# Patient Record
Sex: Female | Born: 1950 | Race: White | Hispanic: No | Marital: Married | Smoking: Never smoker
Health system: Southern US, Community
[De-identification: ages and names within clinical notes are randomized; demographics above are authoritative.]

## PROBLEM LIST (undated history)

## (undated) DIAGNOSIS — K5730 Diverticulosis of large intestine without perforation or abscess without bleeding: Secondary | ICD-10-CM

## (undated) DIAGNOSIS — K589 Irritable bowel syndrome without diarrhea: Secondary | ICD-10-CM

## (undated) DIAGNOSIS — J329 Chronic sinusitis, unspecified: Secondary | ICD-10-CM

## (undated) DIAGNOSIS — Z780 Asymptomatic menopausal state: Secondary | ICD-10-CM

## (undated) DIAGNOSIS — R748 Abnormal levels of other serum enzymes: Secondary | ICD-10-CM

## (undated) DIAGNOSIS — K219 Gastro-esophageal reflux disease without esophagitis: Secondary | ICD-10-CM

## (undated) DIAGNOSIS — I10 Essential (primary) hypertension: Secondary | ICD-10-CM

## (undated) DIAGNOSIS — C762 Malignant neoplasm of abdomen: Secondary | ICD-10-CM

## (undated) DIAGNOSIS — E785 Hyperlipidemia, unspecified: Secondary | ICD-10-CM

## (undated) DIAGNOSIS — N281 Cyst of kidney, acquired: Secondary | ICD-10-CM

## (undated) DIAGNOSIS — C181 Malignant neoplasm of appendix: Secondary | ICD-10-CM

## (undated) DIAGNOSIS — F419 Anxiety disorder, unspecified: Secondary | ICD-10-CM

## (undated) DIAGNOSIS — K635 Polyp of colon: Secondary | ICD-10-CM

## (undated) HISTORY — DX: Polyp of colon: K63.5

## (undated) HISTORY — DX: Anxiety disorder, unspecified: F41.9

## (undated) HISTORY — DX: Diverticulosis of large intestine without perforation or abscess without bleeding: K57.30

## (undated) HISTORY — PX: HEMICOLECTOMY: SHX854

## (undated) HISTORY — DX: Asymptomatic menopausal state: Z78.0

## (undated) HISTORY — DX: Abnormal levels of other serum enzymes: R74.8

## (undated) HISTORY — PX: APPENDECTOMY: SHX54

## (undated) HISTORY — DX: Irritable bowel syndrome, unspecified: K58.9

## (undated) HISTORY — DX: Chronic sinusitis, unspecified: J32.9

## (undated) HISTORY — DX: Gastro-esophageal reflux disease without esophagitis: K21.9

## (undated) HISTORY — DX: Essential (primary) hypertension: I10

## (undated) HISTORY — DX: Cyst of kidney, acquired: N28.1

## (undated) HISTORY — DX: Hyperlipidemia, unspecified: E78.5

## (undated) HISTORY — DX: Malignant neoplasm of abdomen: C76.2

## (undated) HISTORY — PX: KNEE SURGERY: SHX244

## (undated) HISTORY — PX: WRIST SURGERY: SHX841

## (undated) HISTORY — DX: Malignant neoplasm of appendix: C18.1

---

## 2000-02-22 HISTORY — PX: ABDOMINAL HYSTERECTOMY: SHX81

## 2009-10-22 DIAGNOSIS — C181 Malignant neoplasm of appendix: Secondary | ICD-10-CM

## 2009-10-22 HISTORY — DX: Malignant neoplasm of appendix: C18.1

## 2009-11-17 ENCOUNTER — Inpatient Hospital Stay (HOSPITAL_COMMUNITY): Admission: RE | Admit: 2009-11-17 | Discharge: 2009-11-22 | Payer: Self-pay | Admitting: Family Medicine

## 2009-11-18 ENCOUNTER — Encounter (INDEPENDENT_AMBULATORY_CARE_PROVIDER_SITE_OTHER): Payer: Self-pay | Admitting: General Surgery

## 2009-11-23 ENCOUNTER — Ambulatory Visit: Payer: Self-pay | Admitting: Oncology

## 2010-03-09 ENCOUNTER — Ambulatory Visit: Payer: Self-pay | Admitting: Internal Medicine

## 2010-05-06 LAB — URINALYSIS, ROUTINE W REFLEX MICROSCOPIC
Glucose, UA: NEGATIVE mg/dL
Ketones, ur: 40 mg/dL — AB
Leukocytes, UA: NEGATIVE
Nitrite: NEGATIVE
Specific Gravity, Urine: >1.046 — ABNORMAL HIGH (ref 1.005–1.030)
pH: 6 (ref 5.0–8.0)

## 2010-05-06 LAB — DIFFERENTIAL
Basophils Absolute: 0 10*3/uL (ref 0.0–0.1)
Lymphocytes Relative: 13 % (ref 12–46)
Lymphs Abs: 1.5 10*3/uL (ref 0.7–4.0)
Neutro Abs: 10.1 10*3/uL — ABNORMAL HIGH (ref 1.7–7.7)
Neutrophils Relative %: 85 % — ABNORMAL HIGH (ref 43–77)

## 2010-05-06 LAB — BASIC METABOLIC PANEL
CO2: 26 mEq/L (ref 19–32)
Calcium: 8.4 mg/dL (ref 8.4–10.5)
Creatinine, Ser: 0.58 mg/dL (ref 0.4–1.2)
GFR calc Af Amer: >60 mL/min (ref >60)
GFR calc non Af Amer: >60 mL/min (ref >60)
Glucose, Bld: 190 mg/dL — ABNORMAL HIGH (ref 70–99)

## 2010-05-06 LAB — COMPREHENSIVE METABOLIC PANEL
BUN: 9 mg/dL (ref 6–23)
CO2: 26 mEq/L (ref 19–32)
Calcium: 8.9 mg/dL (ref 8.4–10.5)
Chloride: 105 mEq/L (ref 96–112)
Creatinine, Ser: 0.53 mg/dL (ref 0.4–1.2)
GFR calc non Af Amer: >60 mL/min (ref >60)
Total Bilirubin: 0.8 mg/dL (ref 0.3–1.2)

## 2010-05-06 LAB — CBC
Hemoglobin: 12.5 g/dL (ref 12.0–15.0)
Hemoglobin: 9.7 g/dL — ABNORMAL LOW (ref 12.0–15.0)
MCH: 29.5 pg (ref 26.0–34.0)
MCH: 29.8 pg (ref 26.0–34.0)
MCHC: 34.5 g/dL (ref 30.0–36.0)
MCHC: 34.7 g/dL (ref 30.0–36.0)
MCV: 85.9 fL (ref 78.0–100.0)
Platelets: 229 10*3/uL (ref 150–400)
Platelets: 248 10*3/uL (ref 150–400)
RBC: 3.26 MIL/uL — ABNORMAL LOW (ref 3.87–5.11)
RBC: 3.94 MIL/uL (ref 3.87–5.11)
RBC: 4.21 MIL/uL (ref 3.87–5.11)
RDW: 12.1 % (ref 11.5–15.5)
WBC: 9.6 10*3/uL (ref 4.0–10.5)

## 2010-05-06 LAB — POCT I-STAT, CHEM 8
BUN: 14 mg/dL (ref 6–23)
Calcium, Ion: 0.98 mmol/L — ABNORMAL LOW (ref 1.12–1.32)
Chloride: 105 mEq/L (ref 96–112)
Creatinine, Ser: 0.5 mg/dL (ref 0.4–1.2)
Glucose, Bld: 91 mg/dL (ref 70–99)
HCT: 39 % (ref 36.0–46.0)
Hemoglobin: 13.3 g/dL (ref 12.0–15.0)
Potassium: 4.6 mEq/L (ref 3.5–5.1)
Sodium: 135 mEq/L (ref 135–145)
TCO2: 24 mmol/L (ref 0–100)

## 2010-05-06 LAB — ANAEROBIC CULTURE

## 2010-05-06 LAB — URINE MICROSCOPIC-ADD ON

## 2010-05-06 LAB — CULTURE, ROUTINE-ABSCESS: Culture: NORMAL

## 2010-06-01 ENCOUNTER — Ambulatory Visit (INDEPENDENT_AMBULATORY_CARE_PROVIDER_SITE_OTHER): Payer: BC Managed Care – PPO | Admitting: Internal Medicine

## 2010-06-01 DIAGNOSIS — K922 Gastrointestinal hemorrhage, unspecified: Secondary | ICD-10-CM

## 2010-06-01 DIAGNOSIS — J309 Allergic rhinitis, unspecified: Secondary | ICD-10-CM

## 2010-06-01 DIAGNOSIS — E785 Hyperlipidemia, unspecified: Secondary | ICD-10-CM

## 2010-07-01 ENCOUNTER — Encounter: Payer: Self-pay | Admitting: Internal Medicine

## 2010-07-21 ENCOUNTER — Ambulatory Visit (INDEPENDENT_AMBULATORY_CARE_PROVIDER_SITE_OTHER): Payer: BC Managed Care – PPO | Admitting: Internal Medicine

## 2010-07-21 DIAGNOSIS — G4700 Insomnia, unspecified: Secondary | ICD-10-CM

## 2010-07-21 DIAGNOSIS — M7660 Achilles tendinitis, unspecified leg: Secondary | ICD-10-CM

## 2010-08-30 ENCOUNTER — Telehealth: Payer: Self-pay | Admitting: Emergency Medicine

## 2010-08-30 ENCOUNTER — Other Ambulatory Visit: Payer: Self-pay | Admitting: Internal Medicine

## 2010-08-30 DIAGNOSIS — I10 Essential (primary) hypertension: Secondary | ICD-10-CM

## 2010-08-30 MED ORDER — ENALAPRIL MALEATE 5 MG PO TABS: ORAL_TABLET | ORAL | Status: DC

## 2010-08-30 NOTE — Telephone Encounter (Signed)
Pt requests refill of Enalapril 7.5mg  one tab PO q day, #90 to [facility] order pharamcy

## 2010-09-02 ENCOUNTER — Other Ambulatory Visit: Payer: Self-pay | Admitting: Emergency Medicine

## 2010-09-02 MED ORDER — FLUTICASONE PROPIONATE 50 MCG/ACT NA SUSP: 1.0000 | Freq: Every day | NASAL | Status: DC

## 2010-09-02 NOTE — Telephone Encounter (Signed)
[facility] with verbal instructions for refill of Flonase

## 2010-09-02 NOTE — Telephone Encounter (Signed)
Received faxed refill request for pt's Flonase from [facility].

## 2010-09-02 NOTE — Telephone Encounter (Signed)
[facility] with authorization for refill

## 2010-09-06 ENCOUNTER — Telehealth: Payer: Self-pay | Admitting: Emergency Medicine

## 2010-09-06 NOTE — Telephone Encounter (Signed)
Spoke with Baxter Hire, pharmacy tech at [facility] verbal order for refill of Enalapril as written in last telephone encounter.  Pt is aware refills called in, apologized for delay.  Pt reports she has enough to make it until her mail order prescription arrives.  Advised her to call if any problems.

## 2010-09-16 ENCOUNTER — Encounter: Payer: Self-pay | Admitting: Internal Medicine

## 2010-09-16 ENCOUNTER — Ambulatory Visit (INDEPENDENT_AMBULATORY_CARE_PROVIDER_SITE_OTHER): Payer: BC Managed Care – PPO | Admitting: Internal Medicine

## 2010-09-16 VITALS — BP 127/75 | HR 71 | Temp 98.4°F | Resp 12 | Ht 61.0 in | Wt 154.0 lb

## 2010-09-16 DIAGNOSIS — Q619 Cystic kidney disease, unspecified: Secondary | ICD-10-CM

## 2010-09-16 DIAGNOSIS — Z23 Encounter for immunization: Secondary | ICD-10-CM

## 2010-09-16 DIAGNOSIS — N952 Postmenopausal atrophic vaginitis: Secondary | ICD-10-CM

## 2010-09-16 DIAGNOSIS — Z0000 Encounter for general adult medical examination without abnormal findings: Secondary | ICD-10-CM

## 2010-09-16 DIAGNOSIS — R319 Hematuria, unspecified: Secondary | ICD-10-CM

## 2010-09-16 DIAGNOSIS — N281 Cyst of kidney, acquired: Secondary | ICD-10-CM

## 2010-09-16 LAB — POCT URINALYSIS DIPSTICK
Bilirubin, UA: NEGATIVE
Glucose, UA: NEGATIVE
Ketones, UA: NEGATIVE
Leukocytes, UA: NEGATIVE

## 2010-09-16 MED ORDER — TETANUS-DIPHTH-ACELL PERTUSSIS 5-2.5-18.5 LF-MCG/0.5 IM SUSP: 0.5000 mL | Freq: Once | INTRAMUSCULAR | Status: DC

## 2010-09-16 MED ORDER — ESTRADIOL 10 MCG VA TABS: ORAL_TABLET | VAGINAL | Status: AC

## 2010-09-18 ENCOUNTER — Encounter: Payer: Self-pay | Admitting: Internal Medicine

## 2010-09-18 DIAGNOSIS — N281 Cyst of kidney, acquired: Secondary | ICD-10-CM | POA: Insufficient documentation

## 2010-09-18 DIAGNOSIS — K589 Irritable bowel syndrome without diarrhea: Secondary | ICD-10-CM | POA: Insufficient documentation

## 2010-09-18 DIAGNOSIS — J329 Chronic sinusitis, unspecified: Secondary | ICD-10-CM | POA: Insufficient documentation

## 2010-09-18 DIAGNOSIS — Z780 Asymptomatic menopausal state: Secondary | ICD-10-CM | POA: Insufficient documentation

## 2010-09-18 DIAGNOSIS — E785 Hyperlipidemia, unspecified: Secondary | ICD-10-CM | POA: Insufficient documentation

## 2010-09-18 DIAGNOSIS — F419 Anxiety disorder, unspecified: Secondary | ICD-10-CM | POA: Insufficient documentation

## 2010-09-18 DIAGNOSIS — K635 Polyp of colon: Secondary | ICD-10-CM | POA: Insufficient documentation

## 2010-09-18 DIAGNOSIS — R748 Abnormal levels of other serum enzymes: Secondary | ICD-10-CM | POA: Insufficient documentation

## 2010-09-18 DIAGNOSIS — A6000 Herpesviral infection of urogenital system, unspecified: Secondary | ICD-10-CM | POA: Insufficient documentation

## 2010-09-18 DIAGNOSIS — R319 Hematuria, unspecified: Secondary | ICD-10-CM | POA: Insufficient documentation

## 2010-09-18 DIAGNOSIS — K219 Gastro-esophageal reflux disease without esophagitis: Secondary | ICD-10-CM | POA: Insufficient documentation

## 2010-09-18 DIAGNOSIS — K5730 Diverticulosis of large intestine without perforation or abscess without bleeding: Secondary | ICD-10-CM | POA: Insufficient documentation

## 2010-09-18 NOTE — Progress Notes (Addendum)
Subjective:    Patient ID: Jacqueline Buchanan, female    DOB: 09/08/1950, 60 y.o.   MRN: [id number]  HPI  Jacqueline Buchanan is overall doing well.  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  She has been trying very hard to lose weight and is proud of the fact she has lost 10 lbs.  She is exercising much more and watching her diet.   Her last visit with her surgical oncologist for Stage II appendiceal carcinoma, Dr. Flonnie Hailstone went well.  See correspondence note.  CEA was 0.9 and CT will be done at next visit with him.  She has occasional crampy pain in area of surgery but none recently.  No BRBPR or dark stools.  Appetitie good.  She had an abd CT in 2011 which showed incidental finding of a questionable complex renal cyst on the Left.  She did not go for U/S as we discussed as she was still trying to recover emotionally from her appendiceal cancer treatment.  She reports vaginal dryness which causes painful sex at times.  No vaginal discharge.  No vaginal bleeding  Last mamogram 12/11 neg. Colonoscopy 2011.  No Known Allergies Past Medical History  Diagnosis Date  . Hypertension   . Hematuria     Chronic  . Sinusitis     Chronic  . 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 Genital herpes   . IBS (irritable bowel syndrome)   . Diverticulosis of colon   . Colon polyps   . Menopause   . Hyperlipidemia   . Renal cyst     left  . GERD (gastroesophageal reflux disease)   . Anxiety   . Abnormal liver enzymes     Chronic bX by Dr. Dorathy Kinsman   Past Surgical History  Procedure Date  . Abdominal hysterectomy 2002  . Appendectomy   . Hemicolectomy     Right  . Knee surgery 1988-2000    multiple  . Wrist surgery 2005-2011    multiple   History   Social History  . Marital Status: Married    Spouse Name: Molly Maduro    Number of Children: 2  . Years of Education: Masters   Occupational History  . TEACHER    Social  History Main Topics  . Smoking status: Never Smoker   . Smokeless tobacco: Never Used  . Alcohol Use: Yes     socially  . Drug Use: No  . Sexually Active: Yes -- Female partner(s)    Birth Control/ Protection: Post-menopausal   Other Topics Concern  . Not on file   Social History Narrative  . No narrative on file   Family History  Problem Relation Age of Onset  . Cancer Mother     ?primary lung, w/ mets to breast  . Cancer Paternal Grandfather     GI  . Breast cancer Paternal Grandmother    Patient Active Problem List  Diagnoses  . Hyperlipidemia  . Hematuria  . Sinusitis  . Cancer of appendix  . Renal cyst  . Genital herpes  . IBS (irritable bowel syndrome)  . GERD (gastroesophageal reflux disease)  . Diverticulosis of colon  . Colon polyps  . Menopause  . Anxiety  . Abnormal liver enzymes   Current Outpatient Prescriptions on File Prior to Visit  Medication Sig Dispense Refill  . aspirin 81 MG tablet Take 81 mg by mouth daily.        Marland Kitchen  b complex vitamins capsule Take 1 capsule by mouth daily.        . calcium gluconate 500 MG tablet Take 500 mg by mouth 2 (two) times daily.        . enalapril (VASOTEC) 5 MG tablet Total dose is 7.5 mg daily.  Take 1 and 1/2 daily  135 tablet  1  . fexofenadine (ALLEGRA) 180 MG tablet Take 180 mg by mouth daily as needed.        Marland Kitchen LORazepam (ATIVAN) 0.5 MG tablet Take 1 mg by mouth at bedtime as needed.       . montelukast (SINGULAIR) 10 MG tablet Take 10 mg by mouth at bedtime.        . Multiple Vitamin (MULTIVITAMIN) tablet Take 1 tablet by mouth daily.        . simvastatin (ZOCOR) 10 MG tablet Take 10 mg by mouth at bedtime.         No current facility-administered medications on file prior to visit.        Review of Systems  See HPI     Objective:   Physical Exam  Nursing note and vitals reviewed. Constitutional: She is oriented to person, place, and time. She appears well-developed and well-nourished.  HENT:    Head: Normocephalic and atraumatic.  Right Ear: Tympanic membrane and ear canal normal. No drainage. Tympanic membrane is not injected and not erythematous.  Left Ear: Tympanic membrane and ear canal normal. No drainage. Tympanic membrane is not injected and not erythematous.  Nose: Nose normal. Right sinus exhibits no maxillary sinus tenderness and no frontal sinus tenderness. Left sinus exhibits no maxillary sinus tenderness and no frontal sinus tenderness.  Mouth/Throat: Oropharynx is clear and moist. No oral lesions. No oropharyngeal exudate.  Eyes: Conjunctivae and EOM are normal. Pupils are equal, round, and reactive to light.  Neck: Normal range of motion. Neck supple. No JVD present. Carotid bruit is not present. No mass and no thyromegaly present.  Cardiovascular: Normal rate, regular rhythm, S1 normal, S2 normal and intact distal pulses.  Exam reveals no gallop and no friction rub.   No murmur heard. Pulses:      Carotid pulses are 2+ on the right side, and 2+ on the left side.      Dorsalis pedis pulses are 2+ on the right side, and 2+ on the left side.       No carotid bruit. No LE edema  Pulmonary/Chest: Breath sounds normal. She has no wheezes. She has no rales. She exhibits no tenderness.  Abdominal: Soft. Bowel sounds are normal. She exhibits no distension and no mass. There is no hepatosplenomegaly. There is no tenderness. There is no CVA tenderness.  Musculoskeletal: Normal range of motion.       No active synovitis to joints.    Lymphadenopathy:    She has no cervical adenopathy.    She has no axillary adenopathy.       Right: No inguinal and no supraclavicular adenopathy present.       Left: No inguinal and no supraclavicular adenopathy present.  Neurological: She is alert and oriented to person, place, and time. She has normal strength and normal reflexes. She displays no tremor. No cranial nerve deficit or sensory deficit. Coordination and gait normal.  Skin: Skin is  warm and dry. No rash noted. No cyanosis. Nails show no clubbing.  Psychiatric: She has a normal mood and affect. Her speech is normal and behavior is normal. Cognition and memory  are normal.          Assessment & Plan:   No problem-specific assessment & plan notes found for this encounter. 1)  HM  Will give TDAP today.  See scanned HM note  UTD mammogram and colonoscopy.  I gave a RX for Zostavax with pt education materials.  2) L renal cyst  : will schedule renal ultrasound to further delineate  3) Atrophic vaginitis:  Will try Vagifem 10 mcg. 1 tab nightly for 2 weeks then 2 times per week  4) S/P Stage II appendiceal  CA  Dr. Flonnie Hailstone following  5) Allergic rhinitis.  Stable  6) Hyperlipidemai  REfill Zocor  7) GERD   Stable  8) Anxiety  Improved  9)  Chronic hematuria  Extensive work up in [place] negative   **Renal ultrasound scheduled 10/01/10 @ 1030am, [facility].  Pt aware of appt per K. Harvell-dhp, rn**

## 2010-10-01 ENCOUNTER — Ambulatory Visit (HOSPITAL_BASED_OUTPATIENT_CLINIC_OR_DEPARTMENT_OTHER)
Admission: RE | Admit: 2010-10-01 | Discharge: 2010-10-01 | Disposition: A | Discharge status: Home / Self Care | Payer: BC Managed Care – PPO | Source: Ambulatory Visit | Attending: Internal Medicine | Admitting: Internal Medicine

## 2010-10-01 DIAGNOSIS — N289 Disorder of kidney and ureter, unspecified: Secondary | ICD-10-CM | POA: Insufficient documentation

## 2010-10-01 DIAGNOSIS — N281 Cyst of kidney, acquired: Secondary | ICD-10-CM

## 2010-10-04 ENCOUNTER — Telehealth: Payer: Self-pay | Admitting: Internal Medicine

## 2010-10-04 DIAGNOSIS — G4700 Insomnia, unspecified: Secondary | ICD-10-CM

## 2010-10-04 MED ORDER — LORAZEPAM 1 MG PO TABS: 1.0000 mg | ORAL_TABLET | Freq: Every evening | ORAL | Status: DC | PRN

## 2010-10-04 NOTE — Telephone Encounter (Signed)
Spoke with pt and gave results of renal ultrasound.  She would like a second opinion from nephrologist.  Will discuss with nephrology group and set up referral Will also refill Lorazepam

## 2010-10-04 NOTE — Telephone Encounter (Signed)
[facility] for [facility]epam refill

## 2010-10-06 ENCOUNTER — Telehealth: Payer: Self-pay | Admitting: Internal Medicine

## 2010-10-06 NOTE — Telephone Encounter (Signed)
Spoke with pt.  This may an issue of payment for administration in a doctor's office.  OK for [place] to give if pt wants to pick up vaccine from Pharmacy

## 2010-10-06 NOTE — Telephone Encounter (Signed)
Spoke with telephone consult with Dr. Patsi Sears.  He looked at images at voiced no worrisome concern for cancer in the cysts of L kidney.  OK to watch expectantly and repeat U/S in 6 months.  Pt informed via telephone

## 2010-10-06 NOTE — Telephone Encounter (Signed)
Spoke with pt.  She states that she went to get zoster vaccine at [facility], but her ins would not cover vaccination.  Cash pay price was $180.  She would like to know how important you feel having zoster vaccine is?  She has spoken with her ins company and the only answer she has gotten is simply her plan does not cover zoster vaccine.  If you feel strongly she should be vaccinated, she is willing to try to go up customer service "food chain" to try to have vaccine covered.  She did have chicken pox as a child as well as re-exposure when her children had chicken pox.  She does also have genital herpes, though does not often have out breaks.  What do you suggest she do regarding vaccine?

## 2010-10-11 ENCOUNTER — Other Ambulatory Visit: Payer: Self-pay | Admitting: Internal Medicine

## 2010-10-11 DIAGNOSIS — E785 Hyperlipidemia, unspecified: Secondary | ICD-10-CM

## 2010-11-04 ENCOUNTER — Other Ambulatory Visit: Payer: Self-pay | Admitting: Internal Medicine

## 2010-11-04 ENCOUNTER — Ambulatory Visit (INDEPENDENT_AMBULATORY_CARE_PROVIDER_SITE_OTHER): Payer: 59 | Admitting: Psychology

## 2010-11-04 DIAGNOSIS — F411 Generalized anxiety disorder: Secondary | ICD-10-CM

## 2010-11-04 DIAGNOSIS — G4700 Insomnia, unspecified: Secondary | ICD-10-CM

## 2010-11-04 MED ORDER — LORAZEPAM 1 MG PO TABS: 1.0000 mg | ORAL_TABLET | Freq: Every evening | ORAL | Status: DC | PRN

## 2010-11-04 NOTE — Telephone Encounter (Signed)
Pt request refill for Lorazepam 1 mg tablet Qty 30.  Ph[facility]acy [facility] phone number [phone].  Call back number for pt [phone].

## 2010-11-04 NOTE — Telephone Encounter (Signed)
[facility]

## 2010-11-09 ENCOUNTER — Encounter: Payer: Self-pay | Admitting: Emergency Medicine

## 2010-11-16 ENCOUNTER — Telehealth: Payer: Self-pay | Admitting: Emergency Medicine

## 2010-11-16 MED ORDER — ALBUTEROL SULFATE HFA 108 (90 BASE) MCG/ACT IN AERS: INHALATION_SPRAY | RESPIRATORY_TRACT | Status: DC

## 2010-11-16 MED ORDER — PREDNISONE 20 MG PO TABS: ORAL_TABLET | ORAL | Status: AC

## 2010-11-16 MED ORDER — FLUTICASONE-SALMETEROL 100-50 MCG/DOSE IN AEPB: 1.0000 | INHALATION_SPRAY | Freq: Two times a day (BID) | RESPIRATORY_TRACT | Status: AC

## 2010-11-16 NOTE — Telephone Encounter (Signed)
Spoke with Jacqueline Buchanan.  She states that her allergies are "kicking my butt".  She states she feels poorly- ears, jaw and head hurts.  She is not running a fever, but overall feels bad.  She requests refill of Advair inhaler (last filled 3/11), dose 100/50mg  BID.  Jacqueline Buchanan does say she feels tight in her chest.  She states historically, she has gotten a round of steroids to "calm" allergies.  Would like to know if she can get steroids now.  Aware I will speak to DDS and call with response

## 2010-11-16 NOTE — Telephone Encounter (Signed)
Spoke with Jacqueline Buchanan  She is feeling tight inher chest.   She is out of all inhalers.  See orders,  Will start Albuterol , Advair, and prednisone 60 mg taper

## 2010-11-29 ENCOUNTER — Telehealth: Payer: Self-pay | Admitting: Internal Medicine

## 2010-11-29 NOTE — Telephone Encounter (Signed)
Pt called and states she is have intense right back pain and she have an idea what is going on. She would like a call back as soon as possible; she can be reach at [phone]

## 2010-11-29 NOTE — Telephone Encounter (Signed)
Spoke with Jacqueline Buchanan.  She states that for 2 days, she has been having "right kidney pain".  She states it feels just like a UTI.  "I have had enough of them to know what it feels like".  I advised her to come into the office to see DDS, give urine specimen.  She is agreeable.  Appt scheduled for tomorrow am at 830.

## 2010-11-30 ENCOUNTER — Encounter: Payer: Self-pay | Admitting: Internal Medicine

## 2010-11-30 ENCOUNTER — Ambulatory Visit (INDEPENDENT_AMBULATORY_CARE_PROVIDER_SITE_OTHER): Payer: BC Managed Care – PPO | Admitting: Internal Medicine

## 2010-11-30 VITALS — BP 117/66 | HR 93 | Temp 99.1°F | Resp 16 | Ht 61.0 in | Wt 151.0 lb

## 2010-11-30 DIAGNOSIS — R141 Gas pain: Secondary | ICD-10-CM

## 2010-11-30 DIAGNOSIS — R143 Flatulence: Secondary | ICD-10-CM

## 2010-11-30 DIAGNOSIS — N390 Urinary tract infection, site not specified: Secondary | ICD-10-CM

## 2010-11-30 DIAGNOSIS — R140 Abdominal distension (gaseous): Secondary | ICD-10-CM

## 2010-11-30 LAB — POCT URINALYSIS DIPSTICK
Glucose, UA: NEGATIVE
Nitrite, UA: NEGATIVE
Spec Grav, UA: 1.02

## 2010-11-30 MED ORDER — CIPROFLOXACIN HCL 500 MG PO TABS: 500.0000 mg | ORAL_TABLET | Freq: Two times a day (BID) | ORAL | Status: AC

## 2010-11-30 NOTE — Patient Instructions (Signed)
Take med as prescribed  If approved by surgeon,  OK to try probiotic restora

## 2010-11-30 NOTE — Progress Notes (Signed)
Addended by: Nelly Laurence H on: 11/30/2010 11:09 AM   Modules accepted: Orders

## 2010-11-30 NOTE — Progress Notes (Signed)
Subjective:    Patient ID: Jacqueline Buchanan, female    DOB: 07-Nov-1950, 60 y.o.   MRN: [id number]  HPI   Jacqueline Buchanan is here for an acute visit.  She is having dysuria, urgency, and R flank pain.  No doucmented fever or N/V.    She also is having lots of gas and crampy discomfort in the abd area near where she was reanastomosed.  Certain foods with bother her.   She alternates diarrhea/constiapation.  No change in color of stool appetite good  Allergies  Allergen Reactions  . Codeine Nausea And Vomiting   Past Medical History  Diagnosis Date  . Hypertension   . Hematuria     Chronic  . Sinusitis     Chronic  . 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 Genital herpes   . IBS (irritable bowel syndrome)   . Diverticulosis of colon   . Colon polyps   . Menopause   . Hyperlipidemia   . Renal cyst     left  . GERD (gastroesophageal reflux disease)   . Anxiety   . Abnormal liver enzymes     Chronic bX by Dr. Dorathy Kinsman   Past Surgical History  Procedure Date  . Abdominal hysterectomy 2002  . Appendectomy   . Hemicolectomy     Right  . Knee surgery 1988-2000    multiple  . Wrist surgery 2005-2011    multiple   History   Social History  . Marital Status: Married    Spouse Name: Molly Maduro    Number of Children: 2  . Years of Education: Masters   Occupational History  . TEACHER    Social History Main Topics  . Smoking status: Never Smoker   . Smokeless tobacco: Never Used  . Alcohol Use: Yes     socially  . Drug Use: No  . Sexually Active: Yes -- Female partner(s)    Birth Control/ Protection: Post-menopausal   Other Topics Concern  . Not on file   Social History Narrative  . No narrative on file   Family History  Problem Relation Age of Onset  . Cancer Mother     ?primary lung, w/ mets to breast  . Cancer Paternal Grandfather     GI  . Breast cancer Paternal Grandmother    Patient Active Problem List  Diagnoses  . Hyperlipidemia  . Hematuria  .  Sinusitis  . Cancer of appendix  . Renal cyst  . Genital herpes  . IBS (irritable bowel syndrome)  . GERD (gastroesophageal reflux disease)  . Diverticulosis of colon  . Colon polyps  . Menopause  . Anxiety  . Abnormal liver enzymes   Current Outpatient Prescriptions on File Prior to Visit  Medication Sig Dispense Refill  . albuterol (PROVENTIL HFA;VENTOLIN HFA) 108 (90 BASE) MCG/ACT inhaler Inhale 2 inhalations 3 times aday  1 Inhaler  0  . aspirin 81 MG tablet Take 81 mg by mouth daily.        Marland Kitchen b complex vitamins capsule Take 1 capsule by mouth daily.        . budesonide (RHINOCORT AQUA) 32 MCG/ACT nasal spray Place 2 sprays into the nose 2 (two) times daily.        . calcium gluconate 500 MG tablet Take 500 mg by mouth 2 (two) times daily.        . Cholecalciferol (VITAMIN D-3 PO) Take 1 tablet by mouth daily.        Marland Kitchen  enalapril (VASOTEC) 5 MG tablet Total dose is 7.5 mg daily.  Take 1 and 1/2 daily  135 tablet  1  . Estradiol (VAGIFEM) 10 MCG TABS Use vaginally nightly for two weeks then twice a week  8 tablet  1  . fexofenadine (ALLEGRA) 180 MG tablet Take 180 mg by mouth daily as needed.        . Fluticasone-Salmeterol (ADVAIR DISKUS) 100-50 MCG/DOSE AEPB Inhale 1 puff into the lungs 2 (two) times daily.  1 each  0  . LORazepam (ATIVAN) 1 MG tablet Take 1 tablet (1 mg total) by mouth at bedtime as needed.  30 tablet  2  . montelukast (SINGULAIR) 10 MG tablet Take 10 mg by mouth at bedtime.        . Multiple Vitamin (MULTIVITAMIN) tablet Take 1 tablet by mouth daily.        . Nutritional Supplements (NUTRITIONAL SUPPLEMENT PO) Take 1 tablet by mouth 2 (two) times daily. Triple Flax 50+       . Nutritional Supplements (NUTRITIONAL SUPPLEMENT PO) Take 1 capsule by mouth daily. African Mango       . simvastatin (ZOCOR) 10 MG tablet TAKE 1 TABLET DAILY  90 tablet  0  . vitamin C (ASCORBIC ACID) 500 MG tablet Take 500 mg by mouth at bedtime.        . vitamin E 400 UNIT capsule Take  400 Units by mouth daily.         Current Facility-Administered Medications on File Prior to Visit  Medication Dose Route Frequency Provider Last Rate Last Dose  . TDaP (BOOSTRIX) injection 0.5 mL  0.5 mL Intramuscular Once Levon Hedger, MD          Review of Systems    see HPI Objective:   Physical Exam Physical Exam  Nursing note and vitals reviewed.  Constitutional: She is oriented to person, place, and time. She appears well-developed and well-nourished.  HENT:  Head: Normocephalic and atraumatic.  Cardiovascular: Normal rate and regular rhythm. Exam reveals no gallop and no friction rub.  No murmur heard.  Pulmonary/Chest: Breath sounds normal. She has no wheezes. She has no rales. ABD:  No HSM  BS active non distended non tender.  Some tenderness in R flank area to palpation  Neurological: She is alert and oriented to person, place, and time.  Skin: Skin is warm and dry.  Psychiatric: She has a normal mood and affect. Her behavior is normal.       Assessment & Plan:  1)  UTI  Urine shows large hgb, pos for LE.  Will send culture.  RX Cipro bid for 7 days 2)  Bloating/flatulence:  Has upcoming appt with Dr. Flonnie Hailstone.  If He OK's can try Restora probiotics.  I gave her samples  OK for influenza vaccine when pt feeling better

## 2010-12-02 LAB — CULTURE, URINE COMPREHENSIVE
Colony Count: NO GROWTH
Organism ID, Bacteria: NO GROWTH

## 2010-12-06 ENCOUNTER — Ambulatory Visit: Payer: BC Managed Care – PPO | Admitting: Internal Medicine

## 2010-12-18 ENCOUNTER — Other Ambulatory Visit: Payer: Self-pay | Admitting: Internal Medicine

## 2010-12-18 DIAGNOSIS — E785 Hyperlipidemia, unspecified: Secondary | ICD-10-CM

## 2010-12-21 ENCOUNTER — Ambulatory Visit (INDEPENDENT_AMBULATORY_CARE_PROVIDER_SITE_OTHER): Payer: BC Managed Care – PPO | Admitting: Emergency Medicine

## 2010-12-21 VITALS — BP 134/77 | HR 85 | Ht 61.0 in | Wt 144.0 lb

## 2010-12-21 DIAGNOSIS — Z23 Encounter for immunization: Secondary | ICD-10-CM

## 2010-12-23 DIAGNOSIS — C762 Malignant neoplasm of abdomen: Secondary | ICD-10-CM

## 2010-12-23 HISTORY — PX: COLON SURGERY: SHX602

## 2010-12-23 HISTORY — DX: Malignant neoplasm of abdomen: C76.2

## 2011-01-19 ENCOUNTER — Other Ambulatory Visit: Payer: Self-pay | Admitting: Emergency Medicine

## 2011-01-19 DIAGNOSIS — Z9109 Other allergy status, other than to drugs and biological substances: Secondary | ICD-10-CM

## 2011-01-19 MED ORDER — ONE-DAILY MULTI VITAMINS PO TABS: 1.0000 | ORAL_TABLET | Freq: Every day | ORAL | Status: AC

## 2011-01-19 NOTE — Telephone Encounter (Signed)
Refill request received for Singulair, 90 day

## 2011-01-20 ENCOUNTER — Ambulatory Visit (INDEPENDENT_AMBULATORY_CARE_PROVIDER_SITE_OTHER): Payer: BC Managed Care – PPO | Admitting: Internal Medicine

## 2011-01-20 ENCOUNTER — Encounter: Payer: Self-pay | Admitting: Internal Medicine

## 2011-01-20 ENCOUNTER — Telehealth: Payer: Self-pay | Admitting: Emergency Medicine

## 2011-01-20 DIAGNOSIS — R109 Unspecified abdominal pain: Secondary | ICD-10-CM

## 2011-01-20 DIAGNOSIS — K3189 Other diseases of stomach and duodenum: Secondary | ICD-10-CM

## 2011-01-20 DIAGNOSIS — R1013 Epigastric pain: Secondary | ICD-10-CM

## 2011-01-20 DIAGNOSIS — R079 Chest pain, unspecified: Secondary | ICD-10-CM

## 2011-01-20 LAB — CBC WITH DIFFERENTIAL/PLATELET
Basophils Absolute: 0.1 10*3/uL (ref 0.0–0.1)
Basophils Relative: 0 % (ref 0–1)
Eosinophils Absolute: 0.1 10*3/uL (ref 0.0–0.7)
Hemoglobin: 9.4 g/dL — ABNORMAL LOW (ref 12.0–15.0)
MCH: 28 pg (ref 26.0–34.0)
MCHC: 32.1 g/dL (ref 30.0–36.0)
Neutro Abs: 9.8 10*3/uL — ABNORMAL HIGH (ref 1.7–7.7)
Neutrophils Relative %: 79 % — ABNORMAL HIGH (ref 43–77)
Platelets: 467 10*3/uL — ABNORMAL HIGH (ref 150–400)
RDW: 13.4 % (ref 11.5–15.5)

## 2011-01-20 LAB — COMPREHENSIVE METABOLIC PANEL
AST: 12 U/L (ref 0–37)
Albumin: 3.5 g/dL (ref 3.5–5.2)
Alkaline Phosphatase: 160 U/L — ABNORMAL HIGH (ref 39–117)
Potassium: 4.7 mEq/L (ref 3.5–5.3)
Sodium: 138 mEq/L (ref 135–145)
Total Bilirubin: 0.2 mg/dL — ABNORMAL LOW (ref 0.3–1.2)
Total Protein: 5.8 g/dL — ABNORMAL LOW (ref 6.0–8.3)

## 2011-01-20 LAB — LIPASE: Lipase: 29 U/L (ref 0–75)

## 2011-01-20 MED ORDER — OMEPRAZOLE-SODIUM BICARBONATE 40-1100 MG PO CAPS: ORAL_CAPSULE | ORAL | Status: AC

## 2011-01-20 NOTE — Telephone Encounter (Signed)
Have pt see me this afternoon

## 2011-01-20 NOTE — Telephone Encounter (Signed)
Jacqueline Buchanan called this morning with c/o "major heartburn".  She states at this point, the only way she can get relief is to make herself vomit.  She is taking Prilosec which is not helping.  Her oncologist told her to try Prevacid, but that made her sick on her stomach.  She would like to know if DDS would recommend a prescription strength PPI.  She is willing to come in to the office to be seen if needed, but she is desperate for some relief.  She is very uncomfortable with constant heartburn.  Aware I will speak with DDS and call back

## 2011-01-20 NOTE — Patient Instructions (Signed)
To have stat labs today  Further treatament based on results  Take Zegerid as prescribed

## 2011-01-20 NOTE — Progress Notes (Signed)
Subjective:    Patient ID: Jacqueline Buchanan, female    DOB: [birth year], 60 y.o.   MRN: [id number]  HPI  Clevie is here for an acute visit.  She is having significant problems with epigastric dyspepsia described as heartburn.    It occurs at night for the last week and pt self induces vomiting to relieve the heartburn.  She is on Prilosec once a day.  She reports she is passing flatus and "had a normal bowel movement today"  She is trying to advance diet post resection of omentum and colon with ileal colonic reanastomosis done on 11/8.   ONly abd pain is when she is gassy or tries to move her bowels.  Describes pain at time as "elephant sitting on her chest at night"  Pain relieved when vomiting.  No diaphoresis, no exertional component, no radiation down l arm or jaw.     She had metastatic disease present along omentum, liver capsule, diaphragm, and umbilicus.  Lymph nodes were benign.  Allergies  Allergen Reactions  . Codeine Nausea And Vomiting   Past Medical History  Diagnosis Date  . Hypertension   . Hematuria     Chronic  . Sinusitis     Chronic  . 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 Genital herpes   . IBS (irritable bowel syndrome)   . Diverticulosis of colon   . Colon polyps   . Menopause   . Hyperlipidemia   . Renal cyst     left  . GERD (gastroesophageal reflux disease)   . Anxiety   . Abnormal liver enzymes     Chronic bX by Dr. Dorathy Kinsman   Past Surgical History  Procedure Date  . Abdominal hysterectomy 2002  . Appendectomy   . Hemicolectomy     Right  . Knee surgery 1988-2000    multiple  . Wrist surgery 2005-2011    multiple   History   Social History  . Marital Status: Married    Spouse Name: Molly Maduro    Number of Children: 2  . Years of Education: Masters   Occupational History  . TEACHER    Social History Main Topics  . Smoking status: Never Smoker   . Smokeless tobacco: Never Used  . Alcohol Use: Yes     socially  . Drug Use: No    . Sexually Active: Yes -- Female partner(s)    Birth Control/ Protection: Post-menopausal   Other Topics Concern  . Not on file   Social History Narrative  . No narrative on file   Family History  Problem Relation Age of Onset  . Cancer Mother     ?primary lung, w/ mets to breast  . Cancer Paternal Grandfather     GI  . Breast cancer Paternal Grandmother    Patient Active Problem List  Diagnoses  . Hyperlipidemia  . Hematuria  . Sinusitis  . Cancer of appendix  . Renal cyst  . Genital herpes  . IBS (irritable bowel syndrome)  . GERD (gastroesophageal reflux disease)  . Diverticulosis of colon  . Colon polyps  . Menopause  . Anxiety  . Abnormal liver enzymes  . Dyspepsia   Current Outpatient Prescriptions on File Prior to Visit  Medication Sig Dispense Refill  . enalapril (VASOTEC) 5 MG tablet Total dose is 7.5 mg daily.  Take 1 and 1/2 daily  135 tablet  1  . montelukast (SINGULAIR) 10 MG tablet Take 10 mg by mouth at  bedtime.        . Multiple Vitamin (MULTIVITAMIN) tablet Take 1 tablet by mouth daily.  90 tablet  3  . Nutritional Supplements (NUTRITIONAL SUPPLEMENT PO) Take 1 tablet by mouth 2 (two) times daily. Triple Flax 50+       . simvastatin (ZOCOR) 10 MG tablet TAKE 1 TABLET DAILY  90 tablet  0  . albuterol (PROVENTIL HFA;VENTOLIN HFA) 108 (90 BASE) MCG/ACT inhaler Inhale 2 inhalations 3 times aday  1 Inhaler  0  . aspirin 81 MG tablet Take 81 mg by mouth daily.        Marland Kitchen b complex vitamins capsule Take 1 capsule by mouth daily.        . budesonide (RHINOCORT AQUA) 32 MCG/ACT nasal spray Place 2 sprays into the nose 2 (two) times daily.        . calcium gluconate 500 MG tablet Take 500 mg by mouth 2 (two) times daily.        . Cholecalciferol (VITAMIN D-3 PO) Take 1 tablet by mouth daily.        . Estradiol (VAGIFEM) 10 MCG TABS Use vaginally nightly for two weeks then twice a week  8 tablet  1  . fexofenadine (ALLEGRA) 180 MG tablet Take 180 mg by mouth daily  as needed.        . Fluticasone-Salmeterol (ADVAIR DISKUS) 100-50 MCG/DOSE AEPB Inhale 1 puff into the lungs 2 (two) times daily.  1 each  0  . LORazepam (ATIVAN) 1 MG tablet Take 1 tablet (1 mg total) by mouth at bedtime as needed.  30 tablet  2  . Nutritional Supplements (NUTRITIONAL SUPPLEMENT PO) Take 1 capsule by mouth daily. African Mango       . vitamin C (ASCORBIC ACID) 500 MG tablet Take 500 mg by mouth at bedtime.        . vitamin E 400 UNIT capsule Take 400 Units by mouth daily.         Current Facility-Administered Medications on File Prior to Visit  Medication Dose Route Frequency Provider Last Rate Last Dose  . DISCONTD: TDaP (BOOSTRIX) injection 0.5 mL  0.5 mL Intramuscular Once Levon Hedger, MD            Review of Systems    see HPI Objective:   Physical Exam Physical Exam  Nursing note and vitals reviewed. She is not orthostatic on my exam Constitutional: She is oriented to person, place, and time. She appears well-developed and well-nourished.  HENT:  Head: Normocephalic and atraumatic.  Cardiovascular: Normal rate and regular rhythm. Exam reveals no gallop and no friction rub.  No murmur heard.  Pulmonary/Chest: Breath sounds normal. She has no wheezes. She has no rales. ABD:  Healing midline surgical scar.  BS active.  Mild tenderness peri incision. No peritoneal signs no referred rebound.  Non distended no HSM.    Neurological: She is alert and oriented to person, place, and time.  Skin: Skin is warm and dry.  Psychiatric: She has a normal mood and affect. Her behavior is normal.          Assessment & Plan:  1)  Epigastric pain/dyspepsia:  I counseled pt I would like to get CT today but she declines.  Will check CBC, chemistries, amylase and lipase.    If abnormal will need to do imaging. Further treatment based on results.     OK to try Zegerid 40 mg bid for one week then once daily.  EKG today  no acute changes  Bland diet.  OK for ensure and  pudding supplements.  Keep well hydrated.   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  She has upcoming appt with medical oncologist 12/14

## 2011-01-20 NOTE — Telephone Encounter (Signed)
Spoke with pt, scheduled for appt this afternoon at 2pm

## 2011-01-26 ENCOUNTER — Telehealth: Payer: Self-pay | Admitting: Emergency Medicine

## 2011-01-26 DIAGNOSIS — R1110 Vomiting, unspecified: Secondary | ICD-10-CM

## 2011-01-26 DIAGNOSIS — C181 Malignant neoplasm of appendix: Secondary | ICD-10-CM

## 2011-01-26 MED ORDER — PROMETHAZINE HCL 12.5 MG PO TABS: 12.5000 mg | ORAL_TABLET | Freq: Four times a day (QID) | ORAL | Status: AC | PRN

## 2011-01-26 MED ORDER — RESTORA PO CAPS: 1.0000 | ORAL_CAPSULE | Freq: Every day | ORAL | Status: AC

## 2011-01-26 NOTE — Telephone Encounter (Signed)
Long conversation with Jacqueline Buchanan, discussed several issues: 1- she is still having regular vomiting spells, but they seem to have a pattern.  She is vomiting in the middle of the night.  She is taking Zegerid BID as prescribed, 30 mins before meals.  She eats dinner around 5:30-6pm, but generally does not take her evening medications until right before bed around 10-11pm without eating anything additional.  She is waking up in the middle of the night with "terrible heartburn", then either vomiting on her own or making herself vomit to relieve the heartburn.  She notes that currently she is taking most of her medications before bed.  I advised her to continue to take Zegerid before meals, move ASA to am dosing by skipping dose tonight and resuming tomorrow morning and then taking the remainder of her evening medications (enalapril, simvastatin and Singulair) after her meal rather than just before bed.  Advised her to try this to see if it seemed to aleviate her symptoms.  If not, will have to return to the office to see DDS.  She verbalized understanding.   2-  She states that her oncologist, Dr. Evette Cristal, advised her it is okay to restart her probiotic.  She liked samples we gave her of [facility], would like to get more samples or a script.  Aware we have plenty of samples and I will leave some at the front desk for her to pick up at her convenience.    3-  Lastly, she wonders if her BP is getting too low on current BP medication dose.  In the hospital, was in upper 70's systolically before d/c, was in the 90s here.  She does not check it at home. She would like to know if she should decrease her enalapril to 2.5mg  (half her current dose) or leave at current dose?  Aware there are multiple factors on BP, we discussed that she is much less active and still recovering from major abdominal surgery.  She is aware I will ask DDS and call back with her recommendations.

## 2011-01-26 NOTE — Telephone Encounter (Signed)
CT scheduled tomorrow at 1030am.  Jacqueline Buchanan is aware, will come to the office for samples and labs when done with CT

## 2011-01-26 NOTE — Telephone Encounter (Signed)
Still having nausea and vomiting at night time only.  Will give Phenergan 25 mg po q6h prn and get CT in am.  Will get CBC and Chem 24  In am as well.  She has upcoming appt with Dr. Flonnie Hailstone  With frequent vomiting, will hold BP meds, simvastatin, and singulair.  OK for probiotic per Dr. Rondel Baton approval and Phenergan prn    Gavin Pound See orders and call pt back with the time to come in for CT scan

## 2011-01-27 ENCOUNTER — Telehealth: Payer: Self-pay | Admitting: Internal Medicine

## 2011-01-27 ENCOUNTER — Ambulatory Visit (HOSPITAL_BASED_OUTPATIENT_CLINIC_OR_DEPARTMENT_OTHER)
Admission: RE | Admit: 2011-01-27 | Discharge: 2011-01-27 | Disposition: A | Discharge status: Home / Self Care | Payer: BC Managed Care – PPO | Source: Ambulatory Visit | Attending: Internal Medicine | Admitting: Internal Medicine

## 2011-01-27 ENCOUNTER — Ambulatory Visit (INDEPENDENT_AMBULATORY_CARE_PROVIDER_SITE_OTHER): Payer: BC Managed Care – PPO | Admitting: Emergency Medicine

## 2011-01-27 VITALS — BP 105/72 | HR 103 | Temp 98.1°F

## 2011-01-27 DIAGNOSIS — R1115 Cyclical vomiting syndrome unrelated to migraine: Secondary | ICD-10-CM

## 2011-01-27 DIAGNOSIS — C181 Malignant neoplasm of appendix: Secondary | ICD-10-CM | POA: Insufficient documentation

## 2011-01-27 DIAGNOSIS — R1110 Vomiting, unspecified: Secondary | ICD-10-CM

## 2011-01-27 DIAGNOSIS — R188 Other ascites: Secondary | ICD-10-CM

## 2011-01-27 DIAGNOSIS — Z09 Encounter for follow-up examination after completed treatment for conditions other than malignant neoplasm: Secondary | ICD-10-CM

## 2011-01-27 NOTE — Progress Notes (Signed)
Addended by: Nelly Laurence H on: 01/27/2011 11:34 AM   Modules accepted: Orders

## 2011-01-27 NOTE — Progress Notes (Addendum)
DDS spoke with pt/son today- advised to go to [facility] ED for further evaluation

## 2011-01-27 NOTE — Telephone Encounter (Signed)
Spoke with Jacqueline Buchanan and gave results of CT showin ileus vs partial SBO.  Jacqueline Buchanan advised to go to [facility]  I informed triage nurse Molly Maduro to expect Jacqueline Buchanan.a nd he states he will contact Dr. Deveron Furlong.  Jacqueline Buchanan voices understanding

## 2011-02-24 ENCOUNTER — Other Ambulatory Visit: Payer: Self-pay | Admitting: Emergency Medicine

## 2011-02-24 DIAGNOSIS — G4700 Insomnia, unspecified: Secondary | ICD-10-CM

## 2011-02-24 MED ORDER — LORAZEPAM 1 MG PO TABS: ORAL_TABLET | ORAL | Status: DC

## 2011-02-24 NOTE — Telephone Encounter (Signed)
I spoke with pt. 2 days ago and was updated with all above.  She mentioned her MD's at [facility] mentioned that abd/pelvic adhesion post surgery may be contributing to vomiting episodes.   Will leave management of partial SBO/ ileus to her surgeon and oncologist.  She knows she can always be seen in office if she is worsening.  Ok for Ativan refill

## 2011-02-24 NOTE — Telephone Encounter (Signed)
Spoke with Jacqueline Buchanan.  She requests refill on Lorazepam.  Wanted to let you know her oncologist is also giving her Lorazepam 0.5mg  to take with Zofran for nausea.  She is not resting well.  She starts chemotherapy tomorrow.  She continues to lose weight, is down to 123lbs.  She is going to be set up with a nutritionist at [facility] to discuss weight maintenance/nutrition during chemo.  She continues to struggle some with nausea, is trying to eat low fiber, low residue diet which seems to help.  Will keep DDS up to date on chemo and other treatments

## 2011-02-24 NOTE — Telephone Encounter (Signed)
Left message on MD voicemail with Rx as above

## 2011-02-28 ENCOUNTER — Telehealth: Payer: Self-pay | Admitting: Internal Medicine

## 2011-02-28 NOTE — Telephone Encounter (Signed)
[name] advised that Jacqueline Buchanan does not have her refill on Lorazepam.  I told her I would call and speak directly to a pharmacist.  She also questions how she will have a mammogram done with port in place.  I advised her to contact either her oncologist or mammography tech with that question, as I am uncertain.  She states she has a call into her oncology office for same.   Refilled called to [place], [place] at [employer], rn

## 2011-02-28 NOTE — Telephone Encounter (Signed)
Pt would like a call back; [phone]; thanks

## 2011-03-07 ENCOUNTER — Telehealth: Payer: Self-pay | Admitting: Internal Medicine

## 2011-03-07 DIAGNOSIS — I10 Essential (primary) hypertension: Secondary | ICD-10-CM

## 2011-03-07 MED ORDER — ENALAPRIL MALEATE 5 MG PO TABS: 5.0000 mg | ORAL_TABLET | Freq: Every day | ORAL | Status: DC

## 2011-03-17 ENCOUNTER — Other Ambulatory Visit: Payer: Self-pay | Admitting: Emergency Medicine

## 2011-03-17 DIAGNOSIS — I10 Essential (primary) hypertension: Secondary | ICD-10-CM

## 2011-03-17 MED ORDER — ENALAPRIL MALEATE 5 MG PO TABS: 5.0000 mg | ORAL_TABLET | Freq: Every day | ORAL | Status: AC

## 2011-03-17 NOTE — Telephone Encounter (Signed)
Jacqueline Buchanan called and stated that she needs her enalapril to be sent to [facility], not to [facility].  She requests a 90 day suupply

## 2011-03-18 ENCOUNTER — Other Ambulatory Visit: Payer: Self-pay | Admitting: Internal Medicine

## 2011-03-21 ENCOUNTER — Other Ambulatory Visit: Payer: Self-pay | Admitting: Emergency Medicine

## 2011-03-21 DIAGNOSIS — E785 Hyperlipidemia, unspecified: Secondary | ICD-10-CM

## 2011-03-21 NOTE — Telephone Encounter (Signed)
Refill request for Marshell's simvastatin received from [facility]

## 2011-03-22 MED ORDER — SIMVASTATIN 10 MG PO TABS: 10.0000 mg | ORAL_TABLET | Freq: Every day | ORAL | Status: DC

## 2011-05-09 ENCOUNTER — Telehealth: Payer: Self-pay | Admitting: Internal Medicine

## 2011-05-09 NOTE — Telephone Encounter (Signed)
Left message on voice mail for Jacqueline Buchanan to return call to the office

## 2011-05-09 NOTE — Telephone Encounter (Signed)
Pt called needing refills as well she have some questions about other medications. Please call pt at [phone]

## 2011-05-09 NOTE — Telephone Encounter (Signed)
Jacqueline Buchanan returned the call, states she is having a lot of abdominal pain and cramping at night as a side effect from the chemo.  She is not sleeping well and does not know what to do?  Suggestions?

## 2011-05-10 NOTE — Telephone Encounter (Signed)
Spoke with pt on 3/18.  No vomiting but cramping at night and diarrhea. Cramping keeps her awake.    She had similar symptoms with Chemothrapy treatments.  I advised her to call her oncologist .  If not better schedule OV with me.  She has had ileus in past and may need imaging.    She voices understanding

## 2011-05-15 ENCOUNTER — Telehealth: Payer: Self-pay | Admitting: Internal Medicine

## 2011-05-15 NOTE — Telephone Encounter (Signed)
Jacqueline Buchanan   Call [facility] and see if Crystalmarie had her 01/2011 mammogram and get report  thanks

## 2011-05-16 NOTE — Telephone Encounter (Signed)
Jacqueline Buchanan is scheduled for her mammogram with [facility] 05-23-11 @ 1240pm

## 2011-05-23 ENCOUNTER — Other Ambulatory Visit: Payer: Self-pay | Admitting: Emergency Medicine

## 2011-05-23 DIAGNOSIS — G4700 Insomnia, unspecified: Secondary | ICD-10-CM

## 2011-05-23 MED ORDER — LORAZEPAM 1 MG PO TABS: ORAL_TABLET | ORAL | Status: DC

## 2011-05-23 MED ORDER — MONTELUKAST SODIUM 10 MG PO TABS: 10.0000 mg | ORAL_TABLET | Freq: Every day | ORAL | Status: AC

## 2011-05-23 NOTE — Telephone Encounter (Signed)
Lorazepam called to MD voicemail at [facility] per pt request

## 2011-05-23 NOTE — Telephone Encounter (Signed)
Jacqueline Buchanan called requesting 2 refills.  She requests Lorazepam be called to [facility] and Singulair be called to [facility] for 90 day refill.  She is otherwise doing okay.  She is recovering from a GI infection- had to postpone chemo, but is beginning to feel better.

## 2011-06-08 ENCOUNTER — Other Ambulatory Visit: Payer: Self-pay | Admitting: Internal Medicine

## 2011-06-08 ENCOUNTER — Telehealth: Payer: Self-pay | Admitting: Internal Medicine

## 2011-06-08 NOTE — Telephone Encounter (Signed)
Message copied by Kendrick Ranch on Wed Jun 08, 2011  4:39 PM ------      Message from: Granville Lewis      Created: Wed Jun 08, 2011  4:35 PM      Regarding: Rena       Pt called stating that her blood pressure was  84/59 @ her chemo appt yesterday and the oncologist recommended that maybe her BP med needed to be decreased.  Her weight yesterday was 131lb

## 2011-06-08 NOTE — Telephone Encounter (Signed)
Tell Jacqueline Buchanan to hold her blood pressure medicine and see me in 4-6 weeks or when she can get in to office

## 2011-06-08 NOTE — Telephone Encounter (Signed)
Pt's bp has been running low 94/59 and was told by oncologist maybe it needed to be decreased.  Per Dr Constance Goltz stop BP med and f/u with her in 4-6 wks.  Appt made for 5/21 @ 9:45.

## 2011-07-12 ENCOUNTER — Encounter: Payer: Self-pay | Admitting: Internal Medicine

## 2011-07-12 ENCOUNTER — Ambulatory Visit (INDEPENDENT_AMBULATORY_CARE_PROVIDER_SITE_OTHER): Payer: BC Managed Care – PPO | Admitting: Internal Medicine

## 2011-07-12 VITALS — BP 110/70 | HR 98 | Temp 99.1°F | Ht 61.5 in | Wt 116.4 lb

## 2011-07-12 DIAGNOSIS — C181 Malignant neoplasm of appendix: Secondary | ICD-10-CM

## 2011-07-12 DIAGNOSIS — G4700 Insomnia, unspecified: Secondary | ICD-10-CM

## 2011-07-12 DIAGNOSIS — E785 Hyperlipidemia, unspecified: Secondary | ICD-10-CM

## 2011-07-12 DIAGNOSIS — R197 Diarrhea, unspecified: Secondary | ICD-10-CM

## 2011-07-12 MED ORDER — ESZOPICLONE 3 MG PO TABS: 3.0000 mg | ORAL_TABLET | Freq: Every day | ORAL | Status: DC

## 2011-07-12 NOTE — Progress Notes (Signed)
Subjective:    Patient ID: Jacqueline Buchanan, female    DOB: March 28, 1950, 61 y.o.   MRN: [id number]  HPI Jacqueline Buchanan is here for followup .  She has not been seen in a while.  She is undergoing chemotherapy for metastatic appendiceal cancer that has been complicated by C. Diff infection, diarrhea, and hypotension during infusion.  Her weight is down to 116.    She is doing better now off antihypertensive meds and after completing antibiotics for her C Diff.  She has two more treaments to go and she reports her oncologist lowered the dose of the last med  She is slightly anemic according to labs done by oncologist  Ativan is not working for her insomnia  Allergies  Allergen Reactions  . Codeine Nausea And Vomiting   Past Medical History  Diagnosis Date  . Hypertension   . Hematuria     Chronic  . Sinusitis     Chronic  . 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 Genital herpes   . IBS (irritable bowel syndrome)   . Diverticulosis of colon   . Colon polyps   . Menopause   . Hyperlipidemia   . Renal cyst     left  . GERD (gastroesophageal reflux disease)   . Anxiety   . Abnormal liver enzymes     Chronic bX by Dr. Dorathy Kinsman  . 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 Abdominal hysterectomy 2002  . Appendectomy   . Hemicolectomy     Right  . Knee surgery 1988-2000    multiple  . Wrist surgery 2005-2011    multiple  . Colon surgery 12/2010    removed right side colon, ovary,various nodes, heated chemotx   History   Social History  . Marital Status: Married    Spouse Name: Molly Maduro    Number of Children: 2  . Years of Education: Masters   Occupational History  . TEACHER    Social History Main Topics  . Smoking status: Never Smoker   . Smokeless tobacco: Never Used  . Alcohol Use: Yes     socially  . Drug Use: No  . Sexually Active: Yes -- Female partner(s)    Birth Control/ Protection: Post-menopausal   Other Topics  Concern  . Not on file   Social History Narrative  . No narrative on file   Family History  Problem Relation Age of Onset  . Cancer Mother     ?primary lung, w/ mets to breast  . Cancer Paternal Grandfather     GI  . Breast cancer Paternal Grandmother    Patient Active Problem List  Diagnoses  . Hyperlipidemia  . Hematuria  . Sinusitis  . Cancer of appendix  . Renal cyst  . Genital herpes  . IBS (irritable bowel syndrome)  . GERD (gastroesophageal reflux disease)  . Diverticulosis of colon  . Colon polyps  . Menopause  . Anxiety  . Abnormal liver enzymes  . Dyspepsia   Current Outpatient Prescriptions on File Prior to Visit  Medication Sig Dispense Refill  . albuterol (PROVENTIL HFA;VENTOLIN HFA) 108 (90 BASE) MCG/ACT inhaler Inhale 2 inhalations 3 times aday  1 Inhaler  0  . aspirin 81 MG tablet Take 81 mg by mouth daily.        . budesonide (RHINOCORT AQUA) 32 MCG/ACT nasal spray Place 2 sprays into the nose 2 (two) times daily.        Marland Kitchen  fexofenadine (ALLEGRA) 180 MG tablet Take 180 mg by mouth daily as needed.        . Fluticasone-Salmeterol (ADVAIR DISKUS) 100-50 MCG/DOSE AEPB Inhale 1 puff into the lungs 2 (two) times daily.  1 each  0  . LORazepam (ATIVAN) 1 MG tablet Take 1 tab bid as needed  30 tablet  2  . montelukast (SINGULAIR) 10 MG tablet Take 1 tablet (10 mg total) by mouth at bedtime.  90 tablet  3  . potassium chloride SA (K-DUR,KLOR-CON) 20 MEQ tablet Take 20 mEq by mouth daily.      . simvastatin (ZOCOR) 10 MG tablet TAKE 1 TABLET AT BEDTIME  90 tablet  1  . b complex vitamins capsule Take 1 capsule by mouth daily.        . calcium gluconate 500 MG tablet Take 500 mg by mouth 2 (two) times daily.        . Cholecalciferol (VITAMIN D-3 PO) Take 1 tablet by mouth daily.        . enalapril (VASOTEC) 5 MG tablet Take 1 tablet (5 mg total) by mouth daily.  90 tablet  0  . Estradiol (VAGIFEM) 10 MCG TABS Use vaginally nightly for two weeks then twice a week  8  tablet  1  . Eszopiclone (ESZOPICLONE) 3 MG TABS Take 1 tablet (3 mg total) by mouth at bedtime. Take immediately before bedtime  30 tablet  2  . Multiple Vitamin (MULTIVITAMIN) tablet Take 1 tablet by mouth daily.  90 tablet  3  . Nutritional Supplements (NUTRITIONAL SUPPLEMENT PO) Take 1 tablet by mouth 2 (two) times daily. Triple Flax 50+       . Nutritional Supplements (NUTRITIONAL SUPPLEMENT PO) Take 1 capsule by mouth daily. African Mango       . omeprazole-sodium bicarbonate (ZEGERID) 40-1100 MG per capsule Take one capsule twice a day for one week then once a day  60 capsule  0  . Probiotic Product (RESTORA) CAPS Take 1 capsule by mouth daily.  30 capsule  1  . vitamin C (ASCORBIC ACID) 500 MG tablet Take 500 mg by mouth at bedtime.        . vitamin E 400 UNIT capsule Take 400 Units by mouth daily.            Review of Systems   see HPI  Objective:   Physical Exam Physical Exam  Nursing note and vitals reviewed.   VEry thin  Constitutional: She is oriented to person, place, and time. She appears well-developed and well-nourished.  HENT:  Head: Normocephalic and atraumatic.  Cardiovascular: Normal rate and regular rhythm. Exam reveals no gallop and no friction rub.  No murmur heard.  Pulmonary/Chest: Breath sounds normal. She has no wheezes. She has no rales.  Neurological: She is alert and oriented to person, place, and time.  Skin: Skin is warm and dry.  Psychiatric: She has a normal mood and affect. Her behavior is normal.          Assessment & Plan:  1)  Low BP  She is now off all antihypertensives  NO dizziness now and hypotension less during chemo tgreatments 2) Hyperlipidemia  Advised ot come off med and I will check cholesterol in 3-6 months 3)  Metastatic appendiceal CA  4)  Chronic diarrhea  S/P C diff treatment 5)  Anemia post chemothrapy 6)  Anxiety/insomnia  Ok to try [place]

## 2011-07-13 DIAGNOSIS — G4700 Insomnia, unspecified: Secondary | ICD-10-CM | POA: Insufficient documentation

## 2011-07-13 DIAGNOSIS — R197 Diarrhea, unspecified: Secondary | ICD-10-CM | POA: Insufficient documentation

## 2011-08-02 ENCOUNTER — Other Ambulatory Visit: Payer: Self-pay | Admitting: *Deleted

## 2011-08-02 DIAGNOSIS — G4700 Insomnia, unspecified: Secondary | ICD-10-CM

## 2011-08-02 MED ORDER — LORAZEPAM 1 MG PO TABS: ORAL_TABLET | ORAL | Status: DC

## 2011-08-02 NOTE — Telephone Encounter (Signed)
Pt called to state that she could not take the Lunesta because it made her very nauseated and felt like she was in a tunnnel.  She wants to go back to Lorazepam and Tylenol PM.  She does need a RF sent to [facility] on [address].

## 2011-09-05 ENCOUNTER — Encounter: Payer: Self-pay | Admitting: *Deleted

## 2011-09-28 ENCOUNTER — Telehealth: Payer: Self-pay | Admitting: Internal Medicine

## 2011-09-28 ENCOUNTER — Other Ambulatory Visit: Payer: Self-pay | Admitting: *Deleted

## 2011-09-28 DIAGNOSIS — G4700 Insomnia, unspecified: Secondary | ICD-10-CM

## 2011-09-28 MED ORDER — LORAZEPAM 1 MG PO TABS: ORAL_TABLET | ORAL | Status: DC

## 2011-09-28 NOTE — Telephone Encounter (Signed)
Pt needs refill on LORazepam (Tab) ATIVAN 1 MG Take 1 tab bid as needed    She needs it sent to [facility] off of [address]... Thanks.Marland KitchenMarland Kitchen

## 2011-09-28 NOTE — Telephone Encounter (Signed)
Pt called requesting RF on Ativan.  One RF given which was called to [facility].

## 2011-10-20 ENCOUNTER — Other Ambulatory Visit: Payer: Self-pay | Admitting: *Deleted

## 2011-10-20 DIAGNOSIS — G4700 Insomnia, unspecified: Secondary | ICD-10-CM

## 2011-10-20 MED ORDER — LORAZEPAM 1 MG PO TABS: ORAL_TABLET | ORAL | Status: DC

## 2011-10-20 NOTE — Telephone Encounter (Signed)
Pt requesting Rf on Ativan 1mg .  She takes 1 1/2 tabs @ bedtime prn and requesting #[phone] be called to [facility]

## 2011-11-15 ENCOUNTER — Telehealth: Payer: Self-pay | Admitting: *Deleted

## 2011-11-15 NOTE — Telephone Encounter (Signed)
Pt requested a treturn call from Dr Constance Goltz, offered to assist pt but she was insistent that MD return call. Will leave message for Dr. Constance Goltz

## 2011-11-24 ENCOUNTER — Other Ambulatory Visit: Payer: Self-pay | Admitting: Internal Medicine

## 2011-11-24 NOTE — Telephone Encounter (Signed)
Jacqueline Buchanan  Call Jacqueline Buchanan and let her know that I received a refill request from her pharmacy for her Simvastatin.  It is ok to stop taking this medication and I will discontinue it  Thanks

## 2011-11-24 NOTE — Telephone Encounter (Signed)
Called pt and let her know that it was ok to stop simvastatin per Dr Constance Goltz

## 2011-11-28 IMAGING — CT CT ABD-PELV W/ CM
2 of 5 series · 16 of 46 positions shown, 18 images · IV contrast (agent unspecified)
Comparison: None.

CLINICAL DATA: Right lower quadrant pain.  Fever and elevated white
blood count.

CT ABDOMEN AND PELVIS WITH CONTRAST
TECHNIQUE: Multidetector CT imaging of the abdomen and pelvis was
performed following the standard protocol during bolus
administration of intravenous contrast.
Contrast: 100 ml Mmnipaque-4II

[Series 2: rtn a/p with · axial · 0.75mm/px · z∈[-438,-38]mm · 13 of 90 slices shown, 15 images]
[im 5/90  soft-tissue]
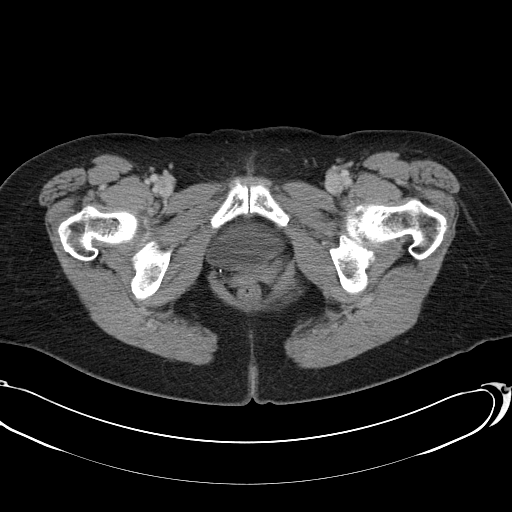
[im 5/90  bone]
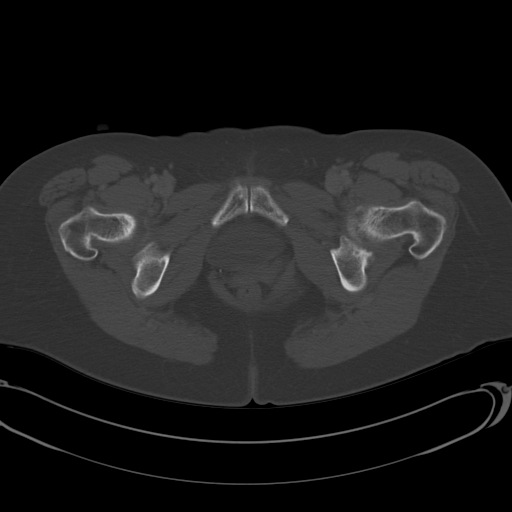
[im 10/90  soft-tissue]
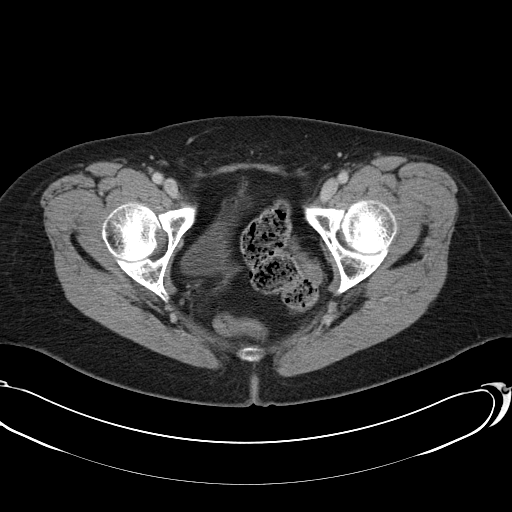
[im 20/90  soft-tissue]
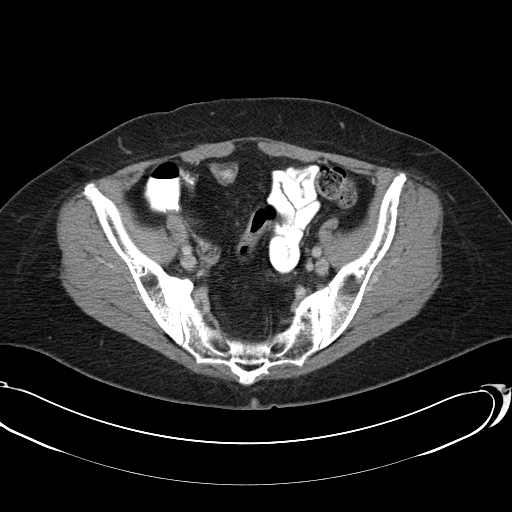
[im 25/90  soft-tissue]
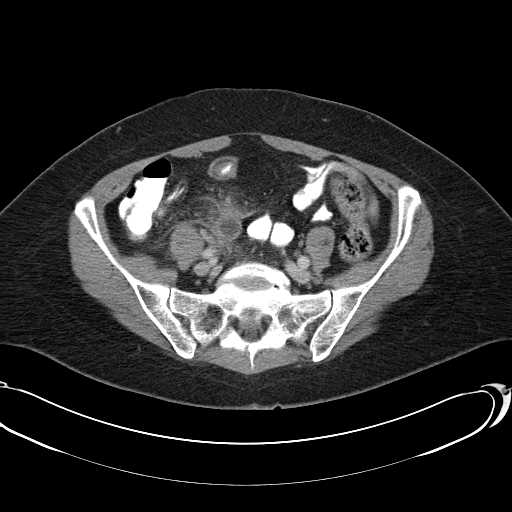
[im 30/90  soft-tissue]
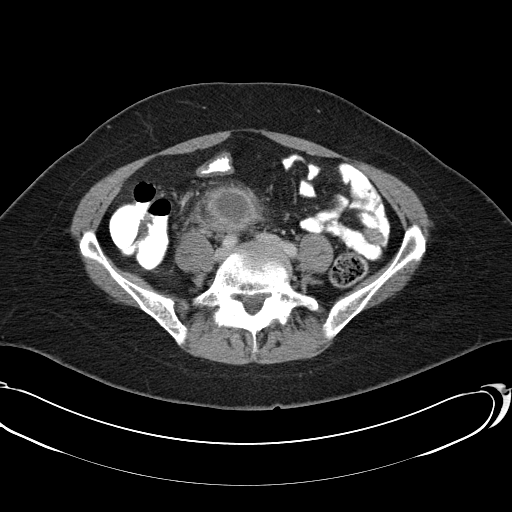
[im 40/90  soft-tissue]
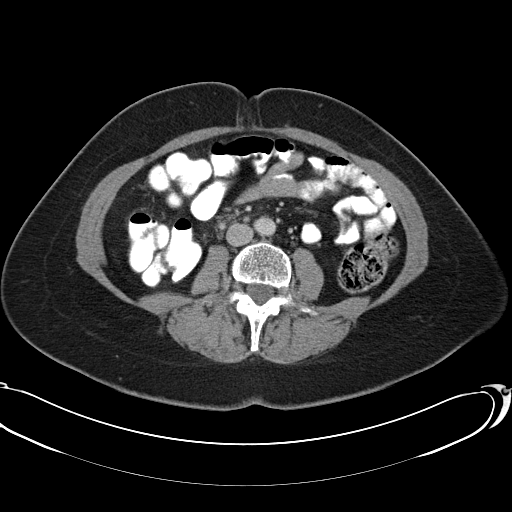
[im 45/90  soft-tissue]
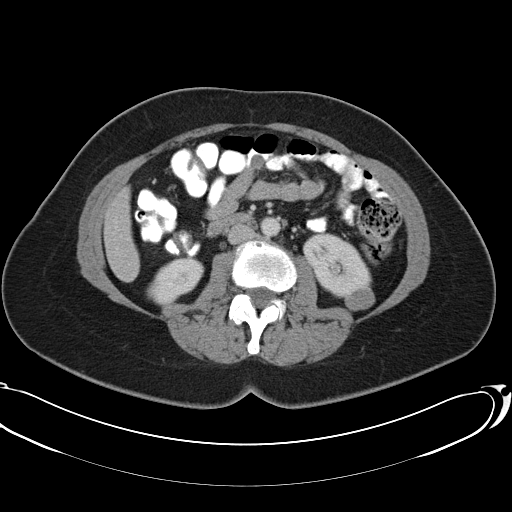
[im 50/90  soft-tissue]
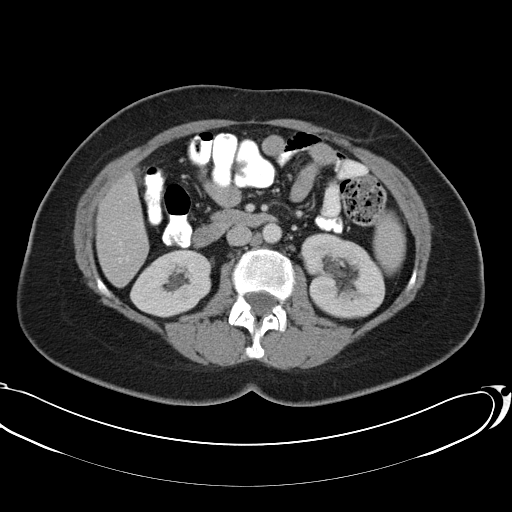
[im 60/90  soft-tissue]
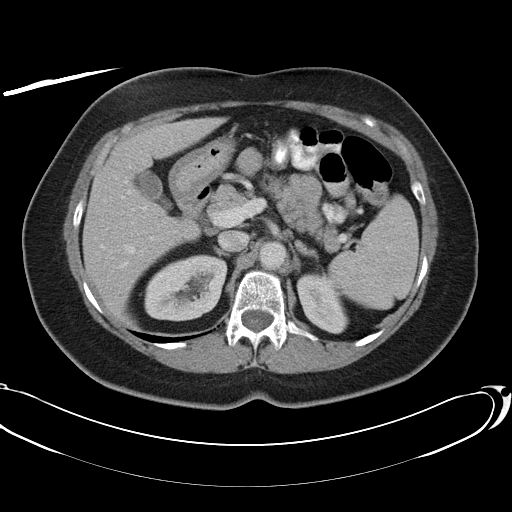
[im 60/90  bone]
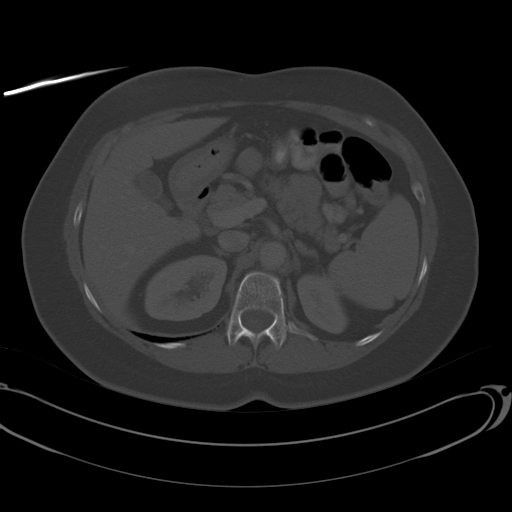
[im 65/90  soft-tissue]
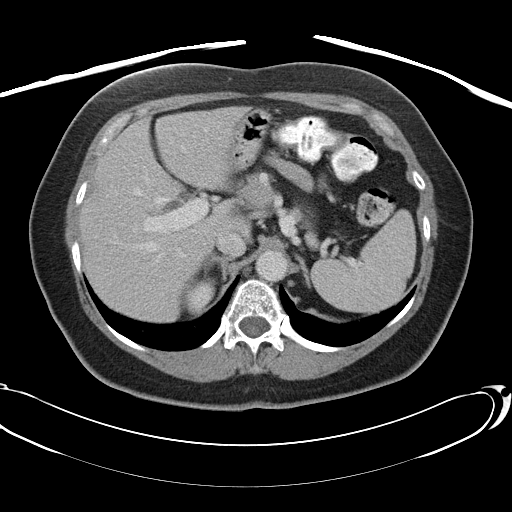
[im 70/90  soft-tissue]
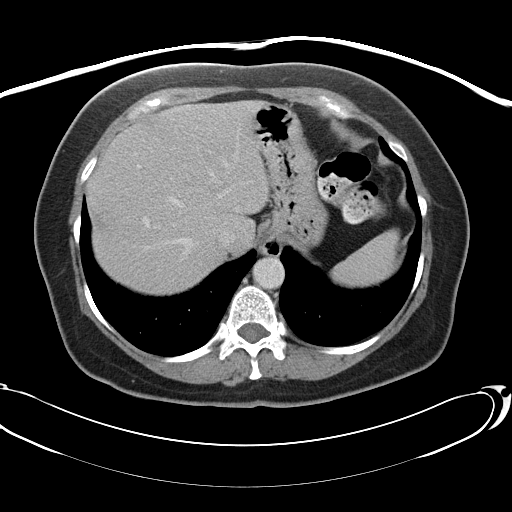
[im 80/90  soft-tissue]
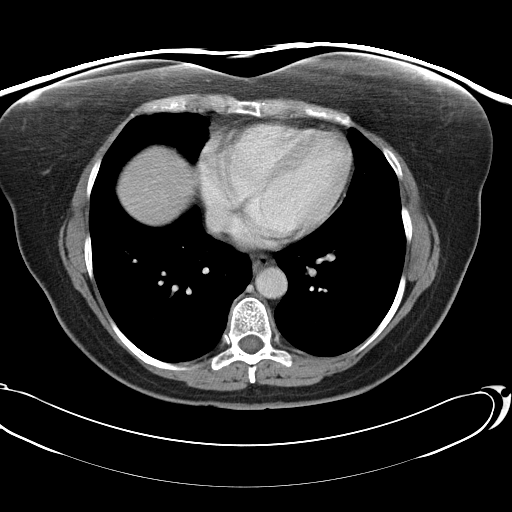
[im 85/90  soft-tissue]
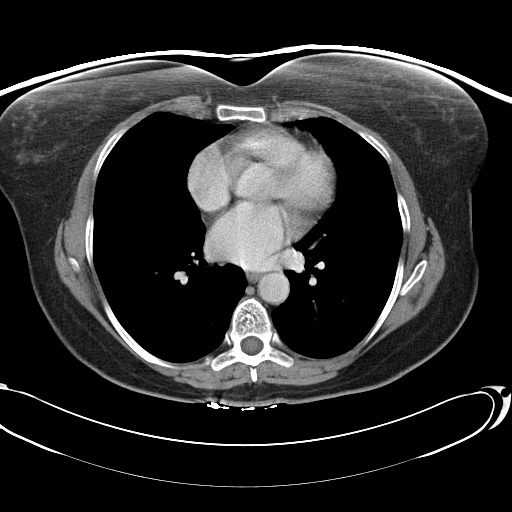

[Series 602: <mpr thick range> · coronal · 0.88mm/px · 3 of 90 slices shown]
[im 30/90  soft-tissue]
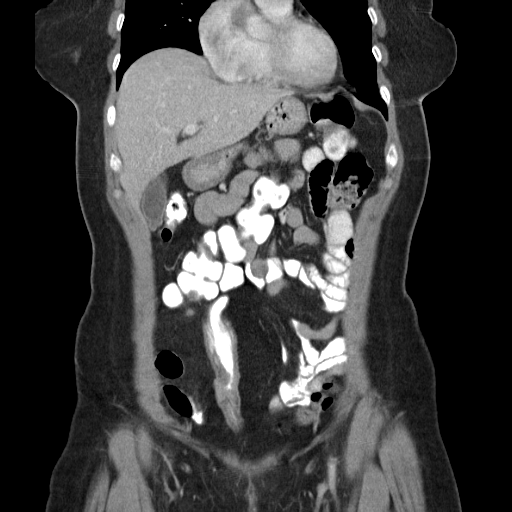
[im 40/90  soft-tissue]
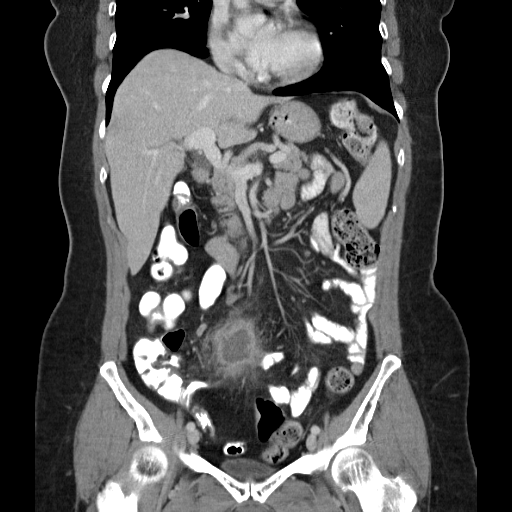
[im 50/90  soft-tissue]
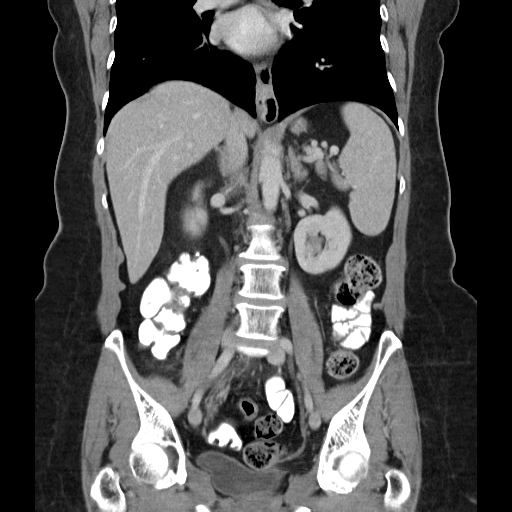

[16 of 46 positions shown; findings below may reference images not displayed]

FINDINGS: The lung bases are clear.  There is a small hiatal
hernia.  Heart size is within normal limits.  The liver is normal
in size and enhancement.  No focal liver lesion or biliary ductal
dilatation.  Portal vein patent.  Common bile duct normal limits
for caliber.

The spleen, adrenal glands, pancreas, gallbladder, and right kidney
within normal limits. Extending exophytically from the lower pole
of the left kidney posteriorly is a 2 cm low density lesion that
measures approximately 35 HU.  Consider follow-up renal ultrasound.
There is no hydronephrosis.

The cecum is positioned in the right lower quadrant and is filled
with oral contrast and has normal appearances.  Extending medially
from the cecum is a dilated appendix, the proximal portion of which
is filled with oral contrast.  Proximal appendix measures up to 11
mm.  More distally, the appendix does not fill with contrast, and
immediately adjacent to and extending cephalad from the distal tip
of the appendix, is a multiloculated thick-walled  rim-enhancing
fluid collection with surrounding fat stranding. The fluid
collection measures 3.6 x 3.5 x 5.7 cm and is compatible with
abscess formation near the distal tip of the appendix. The abscess
directly overlies the right external iliac vasculature. There are
several reactive size lymph nodes in the right lower quadrant
adjacent to the inflamed appendix (image #[id number]).

There is no fluid or abscess in the dependent portion of the
pelvis.  Patient is status post hysterectomy.  No adnexal mass or
lymphadenopathy.

Vertebral bodies normal in height and alignment.  No acute or
suspicious osseous lesion.
IMPRESSION: 1.  Acute appendicitis with focal abscess formation adjacent to the
distal tip of the appendix in the right lower quadrant.  Abscess
formation suggests that the appendix may be ruptured. This is made
a call report.
2.  No significant free intraperitoneal fluid.

3.  2 cm left renal cyst.  Due to Hounsfield units, this may be
mildly complex.  Follow-up outpatient renal ultrasound is
suggested.

## 2011-12-13 ENCOUNTER — Other Ambulatory Visit: Payer: Self-pay | Admitting: *Deleted

## 2011-12-13 DIAGNOSIS — G4700 Insomnia, unspecified: Secondary | ICD-10-CM

## 2011-12-13 NOTE — Telephone Encounter (Signed)
Needs refill will call in pending approval 

## 2011-12-14 ENCOUNTER — Other Ambulatory Visit: Payer: Self-pay | Admitting: *Deleted

## 2011-12-14 DIAGNOSIS — G4700 Insomnia, unspecified: Secondary | ICD-10-CM

## 2011-12-14 MED ORDER — LORAZEPAM 1 MG PO TABS: ORAL_TABLET | ORAL | Status: DC

## 2011-12-14 NOTE — Telephone Encounter (Signed)
Ativan called in to pharmacy

## 2011-12-26 ENCOUNTER — Telehealth: Payer: Self-pay | Admitting: *Deleted

## 2011-12-26 NOTE — Telephone Encounter (Signed)
Spoke with Dr Constance Goltz regarding vaccinations.Advised pt to notify Oncologist

## 2011-12-26 NOTE — Telephone Encounter (Signed)
Pt concerned about PNA and flu vaccinations due to [facility]

## 2012-01-30 ENCOUNTER — Other Ambulatory Visit: Payer: Self-pay | Admitting: *Deleted

## 2012-01-30 DIAGNOSIS — G4700 Insomnia, unspecified: Secondary | ICD-10-CM

## 2012-01-30 MED ORDER — LORAZEPAM 1 MG PO TABS: ORAL_TABLET | ORAL | Status: AC

## 2012-01-30 NOTE — Telephone Encounter (Signed)
Pt requesting 60 tablets will call in to pt preferred pharmacy pending approval

## 2012-01-30 NOTE — Telephone Encounter (Signed)
Medication called oin to pharmacy

## 2012-01-30 NOTE — Telephone Encounter (Signed)
Jacqueline Buchanan  OK to call in for pt

## 2012-02-08 ENCOUNTER — Telehealth: Payer: Self-pay | Admitting: Internal Medicine

## 2012-02-08 NOTE — Telephone Encounter (Signed)
Pt needs Dr. Reece Levy to give her a call thanks.. [phone]

## 2012-02-08 NOTE — Telephone Encounter (Signed)
Returned pt call x 2 to # [phone] wrong # x2 called pt mobile # with no answer finally reached pt on home # pt wanted to update you on her recent developments please return her call

## 2012-02-27 ENCOUNTER — Other Ambulatory Visit: Payer: Self-pay | Admitting: Internal Medicine

## 2012-02-27 NOTE — Telephone Encounter (Signed)
Pt called stating she was put on an anti anxiety Clonanetm (misspelled) 1mg .. Pt states it is working wonders and greatly appreciate everything you all are doing... She needs this prescription to be 60 tablets not 30, because she is taking them as directed one every 12 hrs (basically twice a day)... Please call pt to confirm rx has been called into [facility] [phone] RX [id number]... PT NUMBER IS [phone]

## 2012-02-27 NOTE — Telephone Encounter (Signed)
Called pt left message to confirm which medication she is referring to awaiting return call

## 2012-02-27 NOTE — Telephone Encounter (Signed)
Added klonopin to medications after confirming with pt will call in pending approval See Ardenias note as well

## 2012-02-28 MED ORDER — CLONAZEPAM 1 MG PO TABS: 1.0000 mg | ORAL_TABLET | Freq: Two times a day (BID) | ORAL | Status: DC | PRN

## 2012-02-28 NOTE — Telephone Encounter (Signed)
Medication called in to [facility]

## 2012-03-12 ENCOUNTER — Ambulatory Visit (INDEPENDENT_AMBULATORY_CARE_PROVIDER_SITE_OTHER): Payer: BC Managed Care – PPO | Admitting: Internal Medicine

## 2012-03-12 ENCOUNTER — Encounter: Payer: Self-pay | Admitting: Internal Medicine

## 2012-03-12 VITALS — BP 116/70 | HR 90 | Temp 97.6°F | Resp 16 | Wt 116.0 lb

## 2012-03-12 DIAGNOSIS — F411 Generalized anxiety disorder: Secondary | ICD-10-CM

## 2012-03-12 DIAGNOSIS — K651 Peritoneal abscess: Secondary | ICD-10-CM

## 2012-03-12 DIAGNOSIS — C181 Malignant neoplasm of appendix: Secondary | ICD-10-CM

## 2012-03-12 DIAGNOSIS — F419 Anxiety disorder, unspecified: Secondary | ICD-10-CM

## 2012-03-12 NOTE — Progress Notes (Signed)
Subjective:    Patient ID: Jacqueline Buchanan, female    DOB: 1950-03-11, 62 y.o.   MRN: [id number]  HPI Jacqueline Buchanan is here for follow up.    She reports she is on Cipro and Flagyl for an abscess that was apparently found on CT  ( I do not have reports).  She was unable to tolerate a third antibiotic and does not want a PIC line.  She will have a follow up CT this Friday.  She denies fever or R quadrant pain since starting antibiotic  She states Klonopin is quite helpful.  She especially needs it to go to sleep at night.  She has lots of anxiety with each complication  She eats what she can tolerate.  Diarrhea with beef and other foods  Allergies  Allergen Reactions  . Codeine Nausea And Vomiting   Past Medical History  Diagnosis Date  . Hypertension   . Hematuria     Chronic  . Sinusitis     Chronic  . 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 Genital herpes   . IBS (irritable bowel syndrome)   . Diverticulosis of colon   . Colon polyps   . Menopause   . Hyperlipidemia   . Renal cyst     left  . GERD (gastroesophageal reflux disease)   . Anxiety   . Abnormal liver enzymes     Chronic bX by Dr. Dorathy Kinsman  . 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 Abdominal hysterectomy 2002  . Appendectomy   . Hemicolectomy     Right  . Knee surgery 1988-2000    multiple  . Wrist surgery 2005-2011    multiple  . Colon surgery 12/2010    removed right side colon, ovary,various nodes, heated chemotx   History   Social History  . Marital Status: Married    Spouse Name: Molly Maduro    Number of Children: 2  . Years of Education: Masters   Occupational History  . TEACHER    Social History Main Topics  . Smoking status: Never Smoker   . Smokeless tobacco: Never Used  . Alcohol Use: Yes     Comment: socially  . Drug Use: No  . Sexually Active: Yes -- Female partner(s)    Birth Control/ Protection: Post-menopausal   Other Topics Concern  .  Not on file   Social History Narrative  . No narrative on file   Family History  Problem Relation Age of Onset  . Cancer Mother     ?primary lung, w/ mets to breast  . Cancer Paternal Grandfather     GI  . Breast cancer Paternal Grandmother    Patient Active Problem List  Diagnosis  . Hyperlipidemia  . Hematuria  . Sinusitis  . Cancer of appendix  . Renal cyst  . Genital herpes  . IBS (irritable bowel syndrome)  . GERD (gastroesophageal reflux disease)  . Diverticulosis of colon  . Colon polyps  . Menopause  . Anxiety  . Abnormal liver enzymes  . Dyspepsia  . Insomnia  . Diarrhea  . Abscess of abdominal cavity   Current Outpatient Prescriptions on File Prior to Visit  Medication Sig Dispense Refill  . clonazePAM (KLONOPIN) 1 MG tablet Take 1 tablet (1 mg total) by mouth 2 (two) times daily as needed.  60 tablet  1  . albuterol (PROVENTIL HFA;VENTOLIN HFA) 108 (90 BASE) MCG/ACT inhaler Inhale 2 inhalations 3 times  aday  1 Inhaler  0  . aspirin 81 MG tablet Take 81 mg by mouth daily.        Marland Kitchen b complex vitamins capsule Take 1 capsule by mouth daily.        . budesonide (RHINOCORT AQUA) 32 MCG/ACT nasal spray Place 2 sprays into the nose 2 (two) times daily.        . calcium gluconate 500 MG tablet Take 500 mg by mouth 2 (two) times daily.        . capecitabine (XELODA) 500 MG tablet Take 1,000 mg/m2 by mouth daily.      . Cholecalciferol (VITAMIN D-3 PO) Take 1 tablet by mouth daily.        . enalapril (VASOTEC) 5 MG tablet Take 1 tablet (5 mg total) by mouth daily.  90 tablet  0  . Estradiol (VAGIFEM) 10 MCG TABS Use vaginally nightly for two weeks then twice a week  8 tablet  1  . Eszopiclone (ESZOPICLONE) 3 MG TABS Take 1 tablet (3 mg total) by mouth at bedtime. Take immediately before bedtime  30 tablet  2  . fexofenadine (ALLEGRA) 180 MG tablet Take 180 mg by mouth daily as needed.        . Fluticasone-Salmeterol (ADVAIR DISKUS) 100-50 MCG/DOSE AEPB Inhale 1 puff  into the lungs 2 (two) times daily.  1 each  0  . LORazepam (ATIVAN) 1 MG tablet Take 1 tab bid as needed  60 tablet  2  . montelukast (SINGULAIR) 10 MG tablet Take 1 tablet (10 mg total) by mouth at bedtime.  90 tablet  3  . Multiple Vitamin (MULTIVITAMIN) tablet Take 1 tablet by mouth daily.  90 tablet  3  . Nutritional Supplements (NUTRITIONAL SUPPLEMENT PO) Take 1 tablet by mouth 2 (two) times daily. Triple Flax 50+       . Nutritional Supplements (NUTRITIONAL SUPPLEMENT PO) Take 1 capsule by mouth daily. African Mango       . omeprazole-sodium bicarbonate (ZEGERID) 40-1100 MG per capsule Take one capsule twice a day for one week then once a day  60 capsule  0  . potassium chloride SA (K-DUR,KLOR-CON) 20 MEQ tablet Take 20 mEq by mouth daily.      Marland Kitchen PRESCRIPTION MEDICATION Chemotheraphy Infusion every 3 weeks thru portacath      . Probiotic Product (RESTORA) CAPS Take 1 capsule by mouth daily.  30 capsule  1  . vitamin C (ASCORBIC ACID) 500 MG tablet Take 500 mg by mouth at bedtime.        . vitamin E 400 UNIT capsule Take 400 Units by mouth daily.            Review of Systems see HPI   Objective:   Physical Exam  Physical Exam  Nursing note and vitals reviewed.  Constitutional: She is oriented to person, place, and time. She appears well-developed and well-nourished.  HENT:  Head: Normocephalic and atraumatic.  Cardiovascular: Normal rate and regular rhythm. Exam reveals no gallop and no friction rub.  No murmur heard.  Pulmonary/Chest: Breath sounds normal. She has no wheezes. She has no rales.  Abd:  No pain to deep palpation of R upper and lower quadrant.  BS + Neurological: She is alert and oriented to person, place, and time.  Skin: Skin is warm and dry.  Psychiatric: She has a normal mood and affect. Her behavior is normal.            Assessment & Plan:  Abd abscess continue cipro and flagyl  Appendeceal cancer  Anxiety  Continue Klonopin  See me as needed   Pt declines labs today.  They will be done by Md's at [facility]

## 2012-03-19 ENCOUNTER — Ambulatory Visit (INDEPENDENT_AMBULATORY_CARE_PROVIDER_SITE_OTHER): Payer: BC Managed Care – PPO | Admitting: Licensed Clinical Social Worker

## 2012-03-19 DIAGNOSIS — F411 Generalized anxiety disorder: Secondary | ICD-10-CM

## 2012-03-19 DIAGNOSIS — F331 Major depressive disorder, recurrent, moderate: Secondary | ICD-10-CM

## 2012-05-23 ENCOUNTER — Other Ambulatory Visit: Payer: Self-pay | Admitting: Internal Medicine

## 2012-05-23 MED ORDER — CLONAZEPAM 1 MG PO TABS: 1.0000 mg | ORAL_TABLET | Freq: Two times a day (BID) | ORAL | Status: DC | PRN

## 2012-05-23 NOTE — Telephone Encounter (Signed)
[facility] [place]

## 2012-05-23 NOTE — Telephone Encounter (Signed)
Pt lvm  On 04.02.14 at 359 pm stating she needs refill on clonazePAM (KLONOPIN) 1 MG tablet [facility] [place].Francis Dowse states Dr. Reece Levy have her take this twice a day... If there any questions please call pt at [phone]... She is requesting this be called in today.Marland Kitchen

## 2012-05-28 ENCOUNTER — Ambulatory Visit (INDEPENDENT_AMBULATORY_CARE_PROVIDER_SITE_OTHER): Payer: BC Managed Care – PPO | Admitting: Licensed Clinical Social Worker

## 2012-05-28 DIAGNOSIS — F331 Major depressive disorder, recurrent, moderate: Secondary | ICD-10-CM

## 2012-05-28 DIAGNOSIS — F411 Generalized anxiety disorder: Secondary | ICD-10-CM

## 2012-06-06 ENCOUNTER — Ambulatory Visit (INDEPENDENT_AMBULATORY_CARE_PROVIDER_SITE_OTHER): Payer: BC Managed Care – PPO | Admitting: Licensed Clinical Social Worker

## 2012-06-06 DIAGNOSIS — F331 Major depressive disorder, recurrent, moderate: Secondary | ICD-10-CM

## 2012-06-25 ENCOUNTER — Ambulatory Visit: Payer: BC Managed Care – PPO | Admitting: Licensed Clinical Social Worker

## 2012-07-02 ENCOUNTER — Ambulatory Visit (INDEPENDENT_AMBULATORY_CARE_PROVIDER_SITE_OTHER): Payer: BC Managed Care – PPO | Admitting: Licensed Clinical Social Worker

## 2012-07-02 DIAGNOSIS — F331 Major depressive disorder, recurrent, moderate: Secondary | ICD-10-CM

## 2012-07-02 DIAGNOSIS — F411 Generalized anxiety disorder: Secondary | ICD-10-CM

## 2012-07-11 ENCOUNTER — Telehealth: Payer: Self-pay | Admitting: *Deleted

## 2012-07-11 NOTE — Telephone Encounter (Signed)
Pt states that her Cancer has came back, and she will starting chemo again. Pt states that she has an abcess in her abs wall and may need a pic line. Pt states that she would like to speak with Dr Constance Goltz

## 2012-07-12 ENCOUNTER — Telehealth: Payer: Self-pay | Admitting: Internal Medicine

## 2012-07-12 NOTE — Telephone Encounter (Signed)
I returned call to pt.  Left message on mobile pt not available

## 2012-07-23 ENCOUNTER — Ambulatory Visit: Payer: BC Managed Care – PPO | Admitting: Licensed Clinical Social Worker

## 2012-07-23 ENCOUNTER — Telehealth: Payer: Self-pay | Admitting: *Deleted

## 2012-07-23 MED ORDER — AZITHROMYCIN 250 MG PO TABS: ORAL_TABLET | ORAL | Status: DC

## 2012-07-23 NOTE — Telephone Encounter (Signed)
Pt spoke with Dr Constance Goltz over weekend and Needs Rx for Zpac called in to [facility].

## 2012-08-06 ENCOUNTER — Ambulatory Visit: Payer: BC Managed Care – PPO | Admitting: Licensed Clinical Social Worker

## 2012-08-09 ENCOUNTER — Other Ambulatory Visit: Payer: Self-pay | Admitting: *Deleted

## 2012-08-09 MED ORDER — CLONAZEPAM 1 MG PO TABS: 1.0000 mg | ORAL_TABLET | Freq: Two times a day (BID) | ORAL | Status: DC | PRN

## 2012-08-09 NOTE — Telephone Encounter (Signed)
Called in to pharmacy one month supply per Dr Constance Goltz. Pt just got out of [facility] for infection.

## 2012-08-13 ENCOUNTER — Ambulatory Visit (INDEPENDENT_AMBULATORY_CARE_PROVIDER_SITE_OTHER): Payer: BC Managed Care – PPO | Admitting: Licensed Clinical Social Worker

## 2012-08-13 DIAGNOSIS — F411 Generalized anxiety disorder: Secondary | ICD-10-CM

## 2012-08-13 DIAGNOSIS — F331 Major depressive disorder, recurrent, moderate: Secondary | ICD-10-CM

## 2012-08-26 ENCOUNTER — Encounter: Payer: Self-pay | Admitting: Internal Medicine

## 2012-08-26 DIAGNOSIS — K651 Peritoneal abscess: Secondary | ICD-10-CM | POA: Insufficient documentation

## 2012-09-06 ENCOUNTER — Ambulatory Visit (INDEPENDENT_AMBULATORY_CARE_PROVIDER_SITE_OTHER): Payer: BC Managed Care – PPO | Admitting: Licensed Clinical Social Worker

## 2012-09-06 DIAGNOSIS — F411 Generalized anxiety disorder: Secondary | ICD-10-CM

## 2012-09-06 DIAGNOSIS — F331 Major depressive disorder, recurrent, moderate: Secondary | ICD-10-CM

## 2012-09-10 ENCOUNTER — Other Ambulatory Visit: Payer: Self-pay | Admitting: Internal Medicine

## 2012-09-10 ENCOUNTER — Telehealth: Payer: Self-pay | Admitting: *Deleted

## 2012-09-10 ENCOUNTER — Ambulatory Visit: Payer: BC Managed Care – PPO | Admitting: Licensed Clinical Social Worker

## 2012-09-10 MED ORDER — CLONAZEPAM 2 MG PO TABS: 2.0000 mg | ORAL_TABLET | Freq: Two times a day (BID) | ORAL | Status: DC | PRN

## 2012-09-10 NOTE — Telephone Encounter (Signed)
Called in Klonopin to [facility] [place]

## 2012-09-10 NOTE — Progress Notes (Signed)
Spoke with pt on Sunday evening.    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  Chemotherapy on hold for now  She is increasingly anxious and fearful during daytime.  She is using Klonopin 2 mg at night to help her sleep  Will increase Klonopin to 2 mg bid.  She can use 1/2 dose as necessary to avoid drowsiness

## 2012-09-14 ENCOUNTER — Ambulatory Visit: Payer: BC Managed Care – PPO | Admitting: Licensed Clinical Social Worker

## 2012-09-26 ENCOUNTER — Telehealth: Payer: Self-pay | Admitting: *Deleted

## 2012-09-26 NOTE — Telephone Encounter (Signed)
Dr Constance Goltz said she can reduce dose to 1 mg by cutting 2 mg pills. She may take as needed up to twice per day. Patient was called.

## 2012-09-26 NOTE — Telephone Encounter (Signed)
Jacqueline Buchanan called and  Wants to know if she can decrease her dose of Klonopin to 1 mg. She says the 2 mg is too strong and make her feel foggy headed. She also wants to know if she can cut the 2 mg pills in half.

## 2012-09-28 ENCOUNTER — Ambulatory Visit (INDEPENDENT_AMBULATORY_CARE_PROVIDER_SITE_OTHER): Payer: BC Managed Care – PPO | Admitting: Licensed Clinical Social Worker

## 2012-09-28 DIAGNOSIS — F331 Major depressive disorder, recurrent, moderate: Secondary | ICD-10-CM

## 2012-09-28 DIAGNOSIS — F411 Generalized anxiety disorder: Secondary | ICD-10-CM

## 2012-10-12 ENCOUNTER — Ambulatory Visit: Payer: BC Managed Care – PPO | Admitting: Licensed Clinical Social Worker

## 2012-10-24 ENCOUNTER — Telehealth: Payer: Self-pay | Admitting: *Deleted

## 2012-10-24 NOTE — Telephone Encounter (Signed)
Patient needs a refill on clonazePAM  Wants 1 mg instead of 2 mg because she has a hard time splitting them.

## 2012-10-25 ENCOUNTER — Other Ambulatory Visit: Payer: Self-pay | Admitting: *Deleted

## 2012-10-25 MED ORDER — CLONAZEPAM 1 MG PO TABS: 1.0000 mg | ORAL_TABLET | Freq: Two times a day (BID) | ORAL | Status: DC | PRN

## 2012-10-25 NOTE — Telephone Encounter (Signed)
Pt request 1 mg tablets because she is having a hard time splitting the tablets

## 2012-10-25 NOTE — Telephone Encounter (Signed)
LVM message regarding her RX that needs to be refilled

## 2012-10-26 NOTE — Telephone Encounter (Signed)
[facility]

## 2012-11-12 ENCOUNTER — Telehealth: Payer: Self-pay | Admitting: Internal Medicine

## 2012-11-12 NOTE — Telephone Encounter (Signed)
Pt was discharged from [facility] this am and has had several medication changes.states that she is doing OK. Advised pt to let us know if there is anything we can do for her

## 2012-11-12 NOTE — Telephone Encounter (Signed)
Jacqueline Buchanan  Give pt a courtesy call and see if she went home from [facility] and how she is doing   Route back with her respose

## 2012-11-14 ENCOUNTER — Telehealth: Payer: Self-pay | Admitting: *Deleted

## 2012-11-14 NOTE — Telephone Encounter (Signed)
See Heather's note 

## 2012-11-14 NOTE — Telephone Encounter (Signed)
Jacqueline Buchanan wants you to call her.  Her therapist suggested that a new medication, and she wants information on it.

## 2012-11-16 ENCOUNTER — Ambulatory Visit (INDEPENDENT_AMBULATORY_CARE_PROVIDER_SITE_OTHER): Payer: BC Managed Care – PPO | Admitting: Licensed Clinical Social Worker

## 2012-11-16 DIAGNOSIS — F331 Major depressive disorder, recurrent, moderate: Secondary | ICD-10-CM

## 2012-11-16 DIAGNOSIS — F411 Generalized anxiety disorder: Secondary | ICD-10-CM

## 2012-11-20 ENCOUNTER — Telehealth: Payer: Self-pay | Admitting: Internal Medicine

## 2012-11-20 MED ORDER — BUPROPION HCL ER (XL) 150 MG PO TB24: ORAL_TABLET | ORAL | Status: DC

## 2012-11-20 NOTE — Telephone Encounter (Signed)
Spoke with pt Monday pm  She is seeing therapist Judithe Modest.   Jacqueline Buchanan has had 3 hospitalizations and having daily depressed feelings,  Psychomotor retardation,   Situation with recurrent cancer and chemotherapy treatments along with hospitalizations pt would like to try an antidepressant.  I am in agreement with this.  We discussed side effect profiles and I feel Wellbutrin would be a good choice  Pt is to see me in office in next 2 weeks

## 2012-11-21 ENCOUNTER — Ambulatory Visit: Payer: BC Managed Care – PPO | Admitting: Internal Medicine

## 2012-11-23 ENCOUNTER — Ambulatory Visit: Payer: BC Managed Care – PPO | Admitting: Licensed Clinical Social Worker

## 2012-11-28 ENCOUNTER — Encounter: Payer: Self-pay | Admitting: Internal Medicine

## 2012-11-28 ENCOUNTER — Ambulatory Visit (INDEPENDENT_AMBULATORY_CARE_PROVIDER_SITE_OTHER): Payer: BC Managed Care – PPO | Admitting: Internal Medicine

## 2012-11-28 VITALS — BP 133/78 | HR 81 | Temp 97.9°F | Resp 18 | Wt 104.0 lb

## 2012-11-28 DIAGNOSIS — C181 Malignant neoplasm of appendix: Secondary | ICD-10-CM

## 2012-11-28 DIAGNOSIS — F32A Depression, unspecified: Secondary | ICD-10-CM | POA: Insufficient documentation

## 2012-11-28 DIAGNOSIS — K632 Fistula of intestine: Secondary | ICD-10-CM

## 2012-11-28 DIAGNOSIS — F329 Major depressive disorder, single episode, unspecified: Secondary | ICD-10-CM | POA: Insufficient documentation

## 2012-11-28 DIAGNOSIS — F4321 Adjustment disorder with depressed mood: Secondary | ICD-10-CM

## 2012-11-28 MED ORDER — ESZOPICLONE 2 MG PO TABS: 2.0000 mg | ORAL_TABLET | Freq: Every day | ORAL | Status: DC

## 2012-11-28 NOTE — Progress Notes (Signed)
Subjective:    Patient ID: Jacqueline Buchanan, female    DOB: 21-Aug-1950, 62 y.o.   MRN: [id number]  HPI  Jacqueline Buchanan is here for follow up.  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  She is now on daily Avelox and Diflucan  She was able to attend her sons wedding in [place] and was very glad to be strong enough to do this  She has been seeing Jacqueline Buchanan her therapist who is helping but called me with depressive symptoms,  Primarily situational.   She denies S/H ideation , plan or intent.  She has no psychotic features.    She has recently started WEllbutrin 15o mg and states this is helping aquite a bit.    She still has trouble sleeping at night and would like to try Lunesta    Allergies  Allergen Reactions  . Codeine Nausea And Vomiting   Past Medical History  Diagnosis Date  . Hypertension   . Hematuria     Chronic  . Sinusitis     Chronic  . 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 Genital herpes   . IBS (irritable bowel syndrome)   . Diverticulosis of colon   . Colon polyps   . Menopause   . Hyperlipidemia   . Renal cyst     left  . GERD (gastroesophageal reflux disease)   . Anxiety   . Abnormal liver enzymes     Chronic bX by Dr. Dorathy Kinsman  . 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 Abdominal hysterectomy  2002  . Appendectomy    . Hemicolectomy      Right  . Knee surgery  1988-2000    multiple  . Wrist surgery  2005-2011    multiple  . Colon surgery  12/2010    removed right side colon, ovary,various nodes, heated chemotx   History   Social History  . Marital Status: Married    Spouse Name: Jacqueline Buchanan    Number of Children: 2  . Years of Education: Masters   Occupational History  . TEACHER    Social History Main  Topics  . Smoking status: Never Smoker   . Smokeless tobacco: Never Used  . Alcohol Use: Yes     Comment: socially  . Drug Use: No  . Sexual Activity: Yes    Partners: Male    Birth Control/ Protection: Post-menopausal   Other Topics Concern  . Not on file   Social History Narrative  . No narrative on file   Family History  Problem Relation Age of Onset  . Cancer Mother     ?primary lung, w/ mets to breast  . Cancer Paternal Grandfather     GI  . Breast cancer Paternal Grandmother    Patient Active Problem List   Diagnosis Date Noted  . Depression 11/28/2012  . 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 Intra-abdominal abscess 08/26/2012  . Abscess of abdominal cavity 03/12/2012  . Insomnia 07/13/2011  . Diarrhea 07/13/2011  . Dyspepsia 01/20/2011  . Hyperlipidemia   . Hematuria   . Sinusitis   . Renal cyst   . Genital herpes   . IBS (irritable bowel syndrome)   . GERD (gastroesophageal reflux disease)   . Diverticulosis of colon   .  Colon polyps   . Menopause   . Anxiety   . Abnormal liver enzymes   . Cancer of appendix 10/22/2009   Current Outpatient Prescriptions on File Prior to Visit  Medication Sig Dispense Refill  . aspirin 81 MG tablet Take 81 mg by mouth daily.        Marland Kitchen b complex vitamins capsule Take 1 capsule by mouth daily.        . budesonide (RHINOCORT AQUA) 32 MCG/ACT nasal spray Place 2 sprays into the nose 2 (two) times daily.        Marland Kitchen buPROPion (WELLBUTRIN XL) 150 MG 24 hr tablet Take one tablet daily for one week then two tablets daily  60 tablet  1  . calcium gluconate 500 MG tablet Take 500 mg by mouth 2 (two) times daily.        . capecitabine (XELODA) 500 MG tablet Take 1,000 mg/m2 by mouth daily.      . Cholecalciferol (VITAMIN D-3 PO) Take 1 tablet by mouth daily.        . clonazePAM (KLONOPIN) 1 MG tablet Take 1 tablet (1 mg total) by mouth 2 (two) times daily as needed for anxiety.  30 tablet  1  . clonazePAM (KLONOPIN) 2 MG tablet Take 1  tablet (2 mg total) by mouth 2 (two) times daily as needed for anxiety.  60 tablet  0  . enalapril (VASOTEC) 5 MG tablet Take 1 tablet (5 mg total) by mouth daily.  90 tablet  0  . Estradiol (VAGIFEM) 10 MCG TABS Use vaginally nightly for two weeks then twice a week  8 tablet  1  . Eszopiclone (ESZOPICLONE) 3 MG TABS Take 1 tablet (3 mg total) by mouth at bedtime. Take immediately before bedtime  30 tablet  2  . fexofenadine (ALLEGRA) 180 MG tablet Take 180 mg by mouth daily as needed.        . Fluticasone-Salmeterol (ADVAIR DISKUS) 100-50 MCG/DOSE AEPB Inhale 1 puff into the lungs 2 (two) times daily.  1 each  0  . LORazepam (ATIVAN) 1 MG tablet Take 1 tab bid as needed  60 tablet  2  . metroNIDAZOLE (FLAGYL) 500 MG tablet Take 500 mg by mouth 3 (three) times daily.      . montelukast (SINGULAIR) 10 MG tablet Take 1 tablet (10 mg total) by mouth at bedtime.  90 tablet  3  . Multiple Vitamin (MULTIVITAMIN) tablet Take 1 tablet by mouth daily.  90 tablet  3  . Nutritional Supplements (NUTRITIONAL SUPPLEMENT PO) Take 1 tablet by mouth 2 (two) times daily. Triple Flax 50+       . Nutritional Supplements (NUTRITIONAL SUPPLEMENT PO) Take 1 capsule by mouth daily. African Mango       . omeprazole-sodium bicarbonate (ZEGERID) 40-1100 MG per capsule Take one capsule twice a day for one week then once a day  60 capsule  0  . potassium chloride SA (K-DUR,KLOR-CON) 20 MEQ tablet Take 20 mEq by mouth daily.      Marland Kitchen PRESCRIPTION MEDICATION Chemotheraphy Infusion every 3 weeks thru portacath      . Probiotic Product (RESTORA) CAPS Take 1 capsule by mouth daily.  30 capsule  1  . vitamin C (ASCORBIC ACID) 500 MG tablet Take 500 mg by mouth at bedtime.        . vitamin E 400 UNIT capsule Take 400 Units by mouth daily.         No current facility-administered medications on file  prior to visit.     Review of Systems See HPI    Objective:   Physical Exam  Physical Exam  Nursing note and vitals reviewed.    She has lost over 30 lbs Constitutional: She is oriented to person, place, and time. She is very thin, asthenic HENT:  Head: Normocephalic and atraumatic.  Cardiovascular: Normal rate and regular rhythm. Exam reveals no gallop and no friction rub.  No murmur heard.  Pulmonary/Chest: Breath sounds normal. She has no wheezes. She has no rales.  Neurological: She is alert and oriented to person, place, and time.  Skin: Skin is warm and dry.  Psychiatric: She has a normal mood and affect. Her behavior is normal.            Assessment & Plan:  Depression  OK to increase Wellbutrin to 300 mg daily  If she cannot tolerate I counseled to go back to 150 mg daily  Insomnia  Lunesta 2 mg prn.  If she cannot tolerate ok to use her Klonopin 1/2 or 1 whole dose at night only  Metastatic appendiceal carcinoma  Chronic enterocutaneous fistula  On daily Avelox and Diflucan as prescribed by loncology and ID MD's at [facility]  See me in 3-6 months or prn

## 2012-11-29 DIAGNOSIS — F4321 Adjustment disorder with depressed mood: Secondary | ICD-10-CM | POA: Insufficient documentation

## 2012-11-29 NOTE — Patient Instructions (Signed)
Take medication as prescribed     See me in 3-6 months or prn

## 2012-12-06 ENCOUNTER — Other Ambulatory Visit: Payer: Self-pay | Admitting: *Deleted

## 2012-12-06 MED ORDER — CLONAZEPAM 1 MG PO TABS: 1.0000 mg | ORAL_TABLET | Freq: Two times a day (BID) | ORAL | Status: DC | PRN

## 2012-12-06 NOTE — Telephone Encounter (Signed)
Clarified with pt that she can take 1 and 1/2 lunesta at bedtime. Or 1/2 klonopin at bedtime

## 2012-12-06 NOTE — Telephone Encounter (Signed)
Refill request

## 2012-12-12 ENCOUNTER — Telehealth: Payer: Self-pay | Admitting: *Deleted

## 2012-12-12 NOTE — Telephone Encounter (Signed)
Pt will come in to give [place] the most recent results of her studies and labs

## 2012-12-14 ENCOUNTER — Telehealth: Payer: Self-pay | Admitting: *Deleted

## 2012-12-14 NOTE — Telephone Encounter (Signed)
Pt called stating that her father fell last night and broke his hip and shoulder she is requesting that Dr Constance Goltz please call her

## 2012-12-17 ENCOUNTER — Telehealth: Payer: Self-pay | Admitting: *Deleted

## 2012-12-17 ENCOUNTER — Other Ambulatory Visit: Payer: Self-pay | Admitting: *Deleted

## 2012-12-17 MED ORDER — ESZOPICLONE 2 MG PO TABS: 2.0000 mg | ORAL_TABLET | Freq: Every day | ORAL | Status: DC

## 2012-12-17 NOTE — Telephone Encounter (Signed)
Jacqueline Buchanan needs her Lunesta refilled [facility] [place]

## 2012-12-17 NOTE — Telephone Encounter (Signed)
Refill request

## 2012-12-19 ENCOUNTER — Other Ambulatory Visit: Payer: Self-pay | Admitting: Internal Medicine

## 2012-12-19 ENCOUNTER — Other Ambulatory Visit: Payer: Self-pay | Admitting: *Deleted

## 2012-12-19 MED ORDER — ESZOPICLONE 2 MG PO TABS: ORAL_TABLET | ORAL | Status: DC

## 2012-12-19 MED ORDER — ESZOPICLONE 2 MG PO TABS: 2.0000 mg | ORAL_TABLET | Freq: Every day | ORAL | Status: DC

## 2013-01-10 ENCOUNTER — Other Ambulatory Visit: Payer: Self-pay | Admitting: *Deleted

## 2013-01-10 MED ORDER — ESZOPICLONE 2 MG PO TABS: ORAL_TABLET | ORAL | Status: DC

## 2013-01-10 NOTE — Telephone Encounter (Signed)
Refill request

## 2013-01-10 NOTE — Telephone Encounter (Signed)
Ok to call in

## 2013-01-11 ENCOUNTER — Other Ambulatory Visit: Payer: Self-pay | Admitting: Internal Medicine

## 2013-01-15 ENCOUNTER — Other Ambulatory Visit: Payer: Self-pay | Admitting: *Deleted

## 2013-01-15 ENCOUNTER — Telehealth: Payer: Self-pay | Admitting: *Deleted

## 2013-01-15 NOTE — Telephone Encounter (Signed)
Called in [place] to [facility]

## 2013-01-15 NOTE — Telephone Encounter (Signed)
Jacqueline Buchanan needs PNA vaccine and refill on Lunesta called into [facility]

## 2013-01-16 ENCOUNTER — Ambulatory Visit: Payer: BC Managed Care – PPO | Admitting: *Deleted

## 2013-01-16 MED ORDER — PNEUMOCOCCAL VAC POLYVALENT 25 MCG/0.5ML IJ INJ: 0.5000 mL | INJECTION | INTRAMUSCULAR | Status: DC

## 2013-01-16 NOTE — Addendum Note (Signed)
Addended by: Mathews Robinsons on: 01/16/2013 10:09 AM   Modules accepted: Level of Service

## 2013-01-21 ENCOUNTER — Telehealth: Payer: Self-pay | Admitting: *Deleted

## 2013-01-21 ENCOUNTER — Other Ambulatory Visit: Payer: Self-pay | Admitting: *Deleted

## 2013-01-25 NOTE — Telephone Encounter (Signed)
PA received on RX called in

## 2013-02-01 ENCOUNTER — Ambulatory Visit (INDEPENDENT_AMBULATORY_CARE_PROVIDER_SITE_OTHER): Payer: BC Managed Care – PPO | Admitting: Licensed Clinical Social Worker

## 2013-02-01 DIAGNOSIS — F411 Generalized anxiety disorder: Secondary | ICD-10-CM

## 2013-02-01 DIAGNOSIS — F331 Major depressive disorder, recurrent, moderate: Secondary | ICD-10-CM

## 2013-02-11 ENCOUNTER — Ambulatory Visit (INDEPENDENT_AMBULATORY_CARE_PROVIDER_SITE_OTHER): Payer: BC Managed Care – PPO | Admitting: Licensed Clinical Social Worker

## 2013-02-11 DIAGNOSIS — F331 Major depressive disorder, recurrent, moderate: Secondary | ICD-10-CM

## 2013-02-11 DIAGNOSIS — F411 Generalized anxiety disorder: Secondary | ICD-10-CM

## 2013-03-05 ENCOUNTER — Telehealth: Payer: Self-pay | Admitting: *Deleted

## 2013-03-05 MED ORDER — CLONAZEPAM 2 MG PO TABS: 2.0000 mg | ORAL_TABLET | Freq: Two times a day (BID) | ORAL | Status: AC | PRN

## 2013-03-05 MED ORDER — BUPROPION HCL ER (XL) 150 MG PO TB24: ORAL_TABLET | ORAL | Status: DC

## 2013-03-05 NOTE — Telephone Encounter (Signed)
Klani needs refills  buPROPion (WELLBUTRIN XL) 150 MG 24 hr tablet  clonazePAM (KLONOPIN) 2 MG tablet  [facility]

## 2013-03-11 ENCOUNTER — Ambulatory Visit (INDEPENDENT_AMBULATORY_CARE_PROVIDER_SITE_OTHER): Payer: BC Managed Care – PPO | Admitting: Licensed Clinical Social Worker

## 2013-03-11 DIAGNOSIS — F331 Major depressive disorder, recurrent, moderate: Secondary | ICD-10-CM

## 2013-03-11 DIAGNOSIS — F411 Generalized anxiety disorder: Secondary | ICD-10-CM

## 2013-03-29 ENCOUNTER — Ambulatory Visit (INDEPENDENT_AMBULATORY_CARE_PROVIDER_SITE_OTHER): Payer: BC Managed Care – PPO | Admitting: Licensed Clinical Social Worker

## 2013-03-29 DIAGNOSIS — F331 Major depressive disorder, recurrent, moderate: Secondary | ICD-10-CM

## 2013-03-29 DIAGNOSIS — F411 Generalized anxiety disorder: Secondary | ICD-10-CM

## 2013-04-08 ENCOUNTER — Ambulatory Visit: Payer: BC Managed Care – PPO | Admitting: Licensed Clinical Social Worker

## 2013-04-19 ENCOUNTER — Ambulatory Visit (INDEPENDENT_AMBULATORY_CARE_PROVIDER_SITE_OTHER): Payer: BC Managed Care – PPO | Admitting: Licensed Clinical Social Worker

## 2013-04-19 DIAGNOSIS — F411 Generalized anxiety disorder: Secondary | ICD-10-CM

## 2013-04-19 DIAGNOSIS — F331 Major depressive disorder, recurrent, moderate: Secondary | ICD-10-CM

## 2013-04-24 ENCOUNTER — Encounter: Payer: Self-pay | Admitting: *Deleted

## 2013-04-24 ENCOUNTER — Encounter: Payer: Self-pay | Admitting: Internal Medicine

## 2013-04-24 ENCOUNTER — Ambulatory Visit (INDEPENDENT_AMBULATORY_CARE_PROVIDER_SITE_OTHER): Payer: BC Managed Care – PPO | Admitting: Internal Medicine

## 2013-04-24 VITALS — BP 136/89 | HR 95 | Temp 97.6°F | Resp 16

## 2013-04-24 DIAGNOSIS — J029 Acute pharyngitis, unspecified: Secondary | ICD-10-CM

## 2013-04-24 DIAGNOSIS — R51 Headache: Secondary | ICD-10-CM

## 2013-04-24 MED ORDER — BUPROPION HCL ER (XL) 150 MG PO TB24: ORAL_TABLET | ORAL | Status: AC

## 2013-04-24 MED ORDER — DOXYCYCLINE HYCLATE 100 MG PO TABS: 100.0000 mg | ORAL_TABLET | Freq: Two times a day (BID) | ORAL | Status: AC

## 2013-04-24 MED ORDER — CEFTRIAXONE SODIUM 1 G IJ SOLR
0.5000 g | Freq: Once | INTRAMUSCULAR | Status: AC
  Administered 2013-04-24: 0.5 g via INTRAMUSCULAR

## 2013-04-24 MED ORDER — ESZOPICLONE 2 MG PO TABS
ORAL_TABLET | ORAL | Status: DC
Start: 2013-04-24 — End: 2013-05-13

## 2013-04-24 NOTE — Progress Notes (Signed)
Subjective:    Patient ID: Jacqueline Buchanan, female    DOB: 1951-01-31, 63 y.o.   MRN: [id number]  HPI Jacqueline Buchanan is here for acute visit.  She is undergoing Chemotherapy and last treatement 10 days ago.  Woke up with severe sore throat and swollen glands.  NO documented fever.  Labs done 2/24 showed WBC 6.0,  hgb 10 Platelets 133  Small amount of drainage from fistula  Bloody a few days ago but that has resolved.  No cough, no diarrhea, no N/V    She has also head headache that has been difficult to get rid of.  Allergies  Allergen Reactions  . Codeine Nausea And Vomiting   Past Medical History  Diagnosis Date  . Hypertension   . Hematuria     Chronic  . Sinusitis     Chronic  . 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 Genital herpes   . IBS (irritable bowel syndrome)   . Diverticulosis of colon   . Colon polyps   . Menopause   . Hyperlipidemia   . Renal cyst     left  . GERD (gastroesophageal reflux disease)   . Anxiety   . Abnormal liver enzymes     Chronic bX by Dr. Toy Baker  . 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 Abdominal hysterectomy  2002  . Appendectomy    . Hemicolectomy      Right  . Knee surgery  1988-2000    multiple  . Wrist surgery  2005-2011    multiple  . Colon surgery  12/2010    removed right side colon, ovary,various nodes, heated chemotx   History   Social History  . Marital Status: Married    Spouse Name: Herbie Baltimore    Number of Children: 2  . Years of Education: Masters   Occupational History  . TEACHER    Social History Main Topics  . Smoking status: Never Smoker   . Smokeless tobacco: Never Used  . Alcohol Use: Yes     Comment: socially  . Drug Use: No  . Sexual Activity: Yes    Partners: Male    Birth Control/ Protection: Post-menopausal   Other Topics Concern  . Not on file   Social History Narrative  . No narrative on file   Family History  Problem Relation Age  of Onset  . Cancer Mother     ?primary lung, w/ mets to breast  . Cancer Paternal Grandfather     GI  . Breast cancer Paternal Grandmother    Patient Active Problem List   Diagnosis Date Noted  . Situational depression 11/29/2012  . Depression 11/28/2012  . 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 Intra-abdominal abscess 08/26/2012  . Abscess of abdominal cavity 03/12/2012  . Insomnia 07/13/2011  . Diarrhea 07/13/2011  . Dyspepsia 01/20/2011  . Hyperlipidemia   . Hematuria   . Sinusitis   . Renal cyst   . Genital herpes   . IBS (irritable bowel syndrome)   . GERD (gastroesophageal reflux disease)   . Diverticulosis of colon   . Colon polyps   . Menopause   . Anxiety   . Abnormal liver enzymes   . Cancer of appendix 10/22/2009   Current Outpatient Prescriptions on File Prior to Visit  Medication Sig Dispense Refill  . aspirin 81 MG tablet Take 81 mg by mouth daily.        . budesonide (  RHINOCORT AQUA) 32 MCG/ACT nasal spray Place 2 sprays into the nose 2 (two) times daily.        Marland Kitchen buPROPion (WELLBUTRIN XL) 150 MG 24 hr tablet Take one tablet daily for one week then two tablets daily  60 tablet  1  . eszopiclone (LUNESTA) 2 MG TABS tablet Take one tablet at bedtime  30 tablet  2  . fluconazole (DIFLUCAN) 200 MG tablet Take 200 mg by mouth 2 (two) times daily before a meal.      . LORazepam (ATIVAN) 1 MG tablet Take 1 tab bid as needed  60 tablet  2  . Multiple Vitamin (MULTIVITAMIN) tablet Take 1 tablet by mouth daily.  90 tablet  3  . omeprazole-sodium bicarbonate (ZEGERID) 40-1100 MG per capsule Take one capsule twice a day for one week then once a day  60 capsule  0  . potassium chloride SA (K-DUR,KLOR-CON) 20 MEQ tablet Take 20 mEq by mouth daily.      Marland Kitchen PRESCRIPTION MEDICATION Chemotheraphy Infusion every 3 weeks thru portacath      . b complex vitamins capsule Take 1 capsule by mouth daily.        . calcium gluconate 500 MG tablet Take 500 mg by mouth 2 (two) times  daily.        . capecitabine (XELODA) 500 MG tablet Take 1,000 mg/m2 by mouth daily.      . Cholecalciferol (VITAMIN D-3 PO) Take 1 tablet by mouth daily.        . clonazePAM (KLONOPIN) 2 MG tablet Take 1 tablet (2 mg total) by mouth 2 (two) times daily as needed for anxiety.  60 tablet  0  . enalapril (VASOTEC) 5 MG tablet Take 1 tablet (5 mg total) by mouth daily.  90 tablet  0  . Estradiol (VAGIFEM) 10 MCG TABS Use vaginally nightly for two weeks then twice a week  8 tablet  1  . fexofenadine (ALLEGRA) 180 MG tablet Take 180 mg by mouth daily as needed.        . Fluticasone-Salmeterol (ADVAIR DISKUS) 100-50 MCG/DOSE AEPB Inhale 1 puff into the lungs 2 (two) times daily.  1 each  0  . montelukast (SINGULAIR) 10 MG tablet Take 1 tablet (10 mg total) by mouth at bedtime.  90 tablet  3  . Nutritional Supplements (NUTRITIONAL SUPPLEMENT PO) Take 1 tablet by mouth 2 (two) times daily. Triple Flax 50+       . Nutritional Supplements (NUTRITIONAL SUPPLEMENT PO) Take 1 capsule by mouth daily. African Mango       . Probiotic Product (RESTORA) CAPS Take 1 capsule by mouth daily.  30 capsule  1  . vitamin C (ASCORBIC ACID) 500 MG tablet Take 500 mg by mouth at bedtime.        . vitamin E 400 UNIT capsule Take 400 Units by mouth daily.         No current facility-administered medications on file prior to visit.       Review of Systems See HPI    Objective:   Physical Exam Physical Exam  Constitutional: She is oriented to person, place, and time. She appears well-developed and well-nourished. She is cooperative.  HENT:  Head: Normocephalic and atraumatic.  Right Ear: A middle ear effusion is present.  Left Ear: A middle ear effusion is present.  Nose: Mucosal edema present.  Mouth/Throat: Oropharyngeal exudate and posterior oropharyngeal erythema present.  Serous effusion bilaterally  Eyes: Conjunctivae and EOM are normal.  Pupils are equal, round, and reactive to light.  Neck: Neck supple.  Carotid bruit is not present. No mass present.  Cardiovascular: Regular rhythm, normal heart sounds, intact distal pulses and normal pulses. Exam reveals no gallop and no friction rub.  No murmur heard.  Pulmonary/Chest: Breath sounds normal. She has no wheezes. She has no rhonchi. She has no rales.  Lymphadenopathy:  She has cervical adenopathy.  Neurological: She is alert and oriented to person, place, and time.  Skin: Skin is warm and dry. No abrasion, no bruising, no ecchymosis and no rash noted. No cyanosis. Nails show no clubbing.  Psychiatric: She has a normal mood and affect. Her speech is normal and behavior is normal.            Assessment & Plan:  Pharyngitis:  Strep negative but with immunosupression will empirically treat.  Rocephin 500 mg in office  And 10 day course of Doxycycline   Headache  OK to take 1/2 of her oxycodone  That she has at home  If worsening headache she is to go to [facility]  Pt counseled if any fever developes she is to go to [facility]

## 2013-04-24 NOTE — Patient Instructions (Signed)
Off work for Thursday and Friday  If fever call [facility] Dr. Nelda Marseille

## 2013-04-26 ENCOUNTER — Telehealth: Payer: Self-pay | Admitting: Internal Medicine

## 2013-04-26 ENCOUNTER — Encounter (HOSPITAL_BASED_OUTPATIENT_CLINIC_OR_DEPARTMENT_OTHER): Payer: Self-pay | Admitting: Emergency Medicine

## 2013-04-26 ENCOUNTER — Emergency Department (HOSPITAL_BASED_OUTPATIENT_CLINIC_OR_DEPARTMENT_OTHER)
Admission: EM | Admit: 2013-04-26 | Discharge: 2013-04-26 | Disposition: A | Discharge status: Home / Self Care | Payer: BC Managed Care – PPO | Attending: Emergency Medicine | Admitting: Emergency Medicine

## 2013-04-26 ENCOUNTER — Telehealth: Payer: Self-pay | Admitting: *Deleted

## 2013-04-26 DIAGNOSIS — K1370 Unspecified lesions of oral mucosa: Secondary | ICD-10-CM | POA: Insufficient documentation

## 2013-04-26 DIAGNOSIS — F419 Anxiety disorder, unspecified: Secondary | ICD-10-CM

## 2013-04-26 DIAGNOSIS — Z7982 Long term (current) use of aspirin: Secondary | ICD-10-CM | POA: Insufficient documentation

## 2013-04-26 DIAGNOSIS — Z8742 Personal history of other diseases of the female genital tract: Secondary | ICD-10-CM | POA: Insufficient documentation

## 2013-04-26 DIAGNOSIS — Z8619 Personal history of other infectious and parasitic diseases: Secondary | ICD-10-CM | POA: Insufficient documentation

## 2013-04-26 DIAGNOSIS — R040 Epistaxis: Secondary | ICD-10-CM | POA: Insufficient documentation

## 2013-04-26 DIAGNOSIS — C762 Malignant neoplasm of abdomen: Secondary | ICD-10-CM | POA: Insufficient documentation

## 2013-04-26 DIAGNOSIS — F411 Generalized anxiety disorder: Secondary | ICD-10-CM | POA: Insufficient documentation

## 2013-04-26 DIAGNOSIS — R51 Headache: Secondary | ICD-10-CM | POA: Insufficient documentation

## 2013-04-26 DIAGNOSIS — Z87448 Personal history of other diseases of urinary system: Secondary | ICD-10-CM | POA: Insufficient documentation

## 2013-04-26 DIAGNOSIS — R1110 Vomiting, unspecified: Secondary | ICD-10-CM | POA: Insufficient documentation

## 2013-04-26 DIAGNOSIS — J029 Acute pharyngitis, unspecified: Secondary | ICD-10-CM | POA: Insufficient documentation

## 2013-04-26 DIAGNOSIS — Q619 Cystic kidney disease, unspecified: Secondary | ICD-10-CM | POA: Insufficient documentation

## 2013-04-26 DIAGNOSIS — H9209 Otalgia, unspecified ear: Secondary | ICD-10-CM | POA: Insufficient documentation

## 2013-04-26 DIAGNOSIS — Z79899 Other long term (current) drug therapy: Secondary | ICD-10-CM | POA: Insufficient documentation

## 2013-04-26 DIAGNOSIS — Z860100 Personal history of colon polyps, unspecified: Secondary | ICD-10-CM | POA: Insufficient documentation

## 2013-04-26 DIAGNOSIS — Z8639 Personal history of other endocrine, nutritional and metabolic disease: Secondary | ICD-10-CM | POA: Insufficient documentation

## 2013-04-26 DIAGNOSIS — IMO0002 Reserved for concepts with insufficient information to code with codable children: Secondary | ICD-10-CM | POA: Insufficient documentation

## 2013-04-26 DIAGNOSIS — K219 Gastro-esophageal reflux disease without esophagitis: Secondary | ICD-10-CM | POA: Insufficient documentation

## 2013-04-26 DIAGNOSIS — Z792 Long term (current) use of antibiotics: Secondary | ICD-10-CM | POA: Insufficient documentation

## 2013-04-26 DIAGNOSIS — G479 Sleep disorder, unspecified: Secondary | ICD-10-CM | POA: Insufficient documentation

## 2013-04-26 DIAGNOSIS — R5381 Other malaise: Secondary | ICD-10-CM | POA: Insufficient documentation

## 2013-04-26 DIAGNOSIS — R5383 Other fatigue: Secondary | ICD-10-CM

## 2013-04-26 DIAGNOSIS — Z862 Personal history of diseases of the blood and blood-forming organs and certain disorders involving the immune mechanism: Secondary | ICD-10-CM | POA: Insufficient documentation

## 2013-04-26 DIAGNOSIS — C181 Malignant neoplasm of appendix: Secondary | ICD-10-CM | POA: Insufficient documentation

## 2013-04-26 DIAGNOSIS — I10 Essential (primary) hypertension: Secondary | ICD-10-CM | POA: Insufficient documentation

## 2013-04-26 NOTE — ED Provider Notes (Signed)
I have personally seen and examined the patient.  I have discussed the plan of care with the resident.  I have reviewed the documentation on PMH/FH/Soc. History.  I have reviewed the documentation of the resident and agree.  Pt well appearing, nontoxic in appearance She was anxious but denies SI  Sharyon Cable, MD 04/26/13 1600

## 2013-04-26 NOTE — Telephone Encounter (Signed)
Received phone call from pts husband.     Mikki Santee states pt very agitated, making statements she want to leave the house,  Unable to sleep and she may have had clenching of face and upper body that he was not sure if it was seizures.   He called 911 last night but they did not transport pt.   I spoke with pt who was crying very upset at her husband and hung up phone abruply.   I advised husband to call police or [phone] and have pt taken to ER. For evaluation.    I spoke with police office at scene and explained that I was concerned about her both medically possible sepsis, infection or metastases to her brain.  When office asked if any of this was life threatening, I told him yes and that she needed to be taken to the ER.

## 2013-04-26 NOTE — ED Provider Notes (Signed)
Patient seen/examined in the Emergency Department in conjunction with Resident Physician Provider [facility] Patient reports anxiety/stress and recent possible strep infection Exam : awake/alert, mild anxiety, uvula midline, no erythema to pharynx, no signs of thrush Plan: she will stop her doxycycline and call her oncologist at [place] of labs from [facility] shows no recent neutropenia    Sharyon Cable, MD 04/26/13 [phone]

## 2013-04-26 NOTE — ED Notes (Signed)
Anxiety, feels "stressed" and wants to be checked for a "secondary infection".

## 2013-04-26 NOTE — Discharge Instructions (Signed)

## 2013-04-26 NOTE — ED Notes (Signed)
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  She is stressed and feels "pulled in a lot of directions".  Seen by PMD 04/24/2013 and diagnosed with strep and taking Doxycycline.  She reports PMD instructed her to stop Wellbutrin while on Doxycycline.  She has a panic attack last night, EMS responded to her home and she declined transport to ED.  Spoke with PMD this am, became upset and husband reports she "went missing".  She tells me she "just wants to be evaluated for a secondary infection", however feels strep throat is improving.

## 2013-04-26 NOTE — ED Provider Notes (Signed)
CSN: [id number]     Arrival date & time 04/26/13  1301 History   First MD Initiated Contact with Patient 04/26/13 1349     Chief Complaint  Patient presents with  . Anxiety   HPI Jacqueline Buchanan is a 63 y.o. female presented to the ED after anxiety attack the night prior. EMS was called and patient declined treatment. Patient reports she is pulled in many different directions with needing to care for her elderly father and she is undergoing treatment for cancer.  Patient was seen at [facility] last week, patient on second round of chemo since august. She was placed on acyclovir 2 1/2 weeks ago. Last chemo treatment was 1 week ago and lasted for 3 days. She was seen for sore throat last week and was started on doxycycline, which she states is not working. She reports two negative rapid streps in office, but start of antibiotics was due to her being in the middle of chemotherapy. She is currently feeling like she has sore throat ,headache, chest cold and bilateral ear pain.  She has had no improvement with her symptoms. She reports a 1 day history of vomiting yellow phlegm x1. She denies fever, abdominal pain or diarrhea. She is eating and drinking well. She feels the doxycyline is increasing her anxiety. In addition she reports she is s/p acyclovir use for mouth lesions that are still present. She has a history of anxiety with klonopin use, however patient states that also made her more nervious so she stopped taking it.  She seems to have "reactions" to many of the antibiotics and anti anxiolytics prescribed to her.    Past Medical History  Diagnosis Date  . Hypertension   . Hematuria     Chronic  . Sinusitis     Chronic  . 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 Genital herpes   . IBS (irritable bowel syndrome)   . Diverticulosis of colon   . Colon polyps   . Menopause   . Hyperlipidemia   . Renal cyst     left  . GERD (gastroesophageal reflux disease)   . Anxiety   . Abnormal  liver enzymes     Chronic bX by Dr. Toy Baker  . 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 Abdominal hysterectomy  2002  . Appendectomy    . Hemicolectomy      Right  . Knee surgery  1988-2000    multiple  . Wrist surgery  2005-2011    multiple  . Colon surgery  12/2010    removed right side colon, ovary,various nodes, heated chemotx   Family History  Problem Relation Age of Onset  . Cancer Mother     ?primary lung, w/ mets to breast  . Cancer Paternal Grandfather     GI  . Breast cancer Paternal Grandmother    History  Substance Use Topics  . Smoking status: Never Smoker   . Smokeless tobacco: Never Used  . Alcohol Use: Yes     Comment: socially   OB History   Grav Para Term Preterm Abortions TAB SAB Ect Mult Living   2 2             Review of Systems  Constitutional: Positive for fatigue. Negative for fever, chills, activity change and appetite change.  HENT: Positive for ear pain, mouth sores, nosebleeds, rhinorrhea and sore throat. Negative for congestion, ear discharge, facial swelling  and sneezing.   Eyes: Negative for pain, discharge and redness.  Respiratory: Negative for cough, shortness of breath and wheezing.   Cardiovascular: Negative for chest pain and palpitations.  Gastrointestinal: Positive for vomiting. Negative for nausea, abdominal pain, diarrhea, constipation and abdominal distention.  Neurological: Positive for headaches.  Psychiatric/Behavioral: Positive for sleep disturbance and agitation. The patient is nervous/anxious.     Allergies  Codeine  Home Medications   Current Outpatient Rx  Name  Route  Sig  Dispense  Refill  . aspirin 81 MG tablet   Oral   Take 81 mg by mouth daily.           Marland Kitchen b complex vitamins capsule   Oral   Take 1 capsule by mouth daily.           . budesonide (RHINOCORT AQUA) 32 MCG/ACT nasal spray   Nasal   Place 2 sprays into the nose 2 (two) times  daily.           Marland Kitchen buPROPion (WELLBUTRIN XL) 150 MG 24 hr tablet      Take one tablet daily for one week then two tablets daily   60 tablet   1   . calcium gluconate 500 MG tablet   Oral   Take 500 mg by mouth 2 (two) times daily.           . capecitabine (XELODA) 500 MG tablet   Oral   Take 1,000 mg/m2 by mouth daily.         . Cholecalciferol (VITAMIN D-3 PO)   Oral   Take 1 tablet by mouth daily.           . clonazePAM (KLONOPIN) 2 MG tablet   Oral   Take 1 tablet (2 mg total) by mouth 2 (two) times daily as needed for anxiety.   60 tablet   0   . doxycycline (VIBRA-TABS) 100 MG tablet   Oral   Take 1 tablet (100 mg total) by mouth 2 (two) times daily.   20 tablet   0   . enalapril (VASOTEC) 5 MG tablet   Oral   Take 1 tablet (5 mg total) by mouth daily.   90 tablet   0   . Estradiol (VAGIFEM) 10 MCG TABS      Use vaginally nightly for two weeks then twice a week   8 tablet   1   . eszopiclone (LUNESTA) 2 MG TABS tablet      Take one tablet at bedtime   30 tablet   2   . fexofenadine (ALLEGRA) 180 MG tablet   Oral   Take 180 mg by mouth daily as needed.           . fluconazole (DIFLUCAN) 200 MG tablet   Oral   Take 200 mg by mouth 2 (two) times daily before a meal.         . EXPIRED: Fluticasone-Salmeterol (ADVAIR DISKUS) 100-50 MCG/DOSE AEPB   Inhalation   Inhale 1 puff into the lungs 2 (two) times daily.   1 each   0   . LORazepam (ATIVAN) 1 MG tablet      Take 1 tab bid as needed   60 tablet   2   . montelukast (SINGULAIR) 10 MG tablet   Oral   Take 1 tablet (10 mg total) by mouth at bedtime.   90 tablet   3   . moxifloxacin (AVELOX) 400 MG tablet   Oral  Take 400 mg by mouth daily at 8 pm.         . Multiple Vitamin (MULTIVITAMIN) tablet   Oral   Take 1 tablet by mouth daily.   90 tablet   3   . Nutritional Supplements (NUTRITIONAL SUPPLEMENT PO)   Oral   Take 1 tablet by mouth 2 (two) times daily. Triple  Flax 50+          . Nutritional Supplements (NUTRITIONAL SUPPLEMENT PO)   Oral   Take 1 capsule by mouth daily. African Mango          . omeprazole-sodium bicarbonate (ZEGERID) 40-1100 MG per capsule      Take one capsule twice a day for one week then once a day   60 capsule   0   . potassium chloride SA (K-DUR,KLOR-CON) 20 MEQ tablet   Oral   Take 20 mEq by mouth daily.         Marland Kitchen PRESCRIPTION MEDICATION      Chemotheraphy Infusion every 3 weeks thru portacath         . Probiotic Product (RESTORA) CAPS   Oral   Take 1 capsule by mouth daily.   30 capsule   1   . vitamin C (ASCORBIC ACID) 500 MG tablet   Oral   Take 500 mg by mouth at bedtime.           . vitamin E 400 UNIT capsule   Oral   Take 400 Units by mouth daily.            BP 143/82  Pulse 112  Temp(Src) 98.1 F (36.7 C) (Oral)  Resp 18  Ht 5\' 2"  (1.575 m)  Wt 110 lb (49.896 kg)  BMI 20.11 kg/m2  SpO2 100% Physical Exam Gen: NAD. Argumentative.  HEENT: AT. [place]. No LAD. Bilateral TM visualized and normal in appearance. Bilateral eyes without injections or icterus. MMM. No mouth open mouth sores. Two healing/scabbedover areas in the mouth noted. Bilateral nares with mild erythema. Throat without erythema or exudates.  CV: Mildly tachycardic.  Chest: CTAB, no wheeze or crackles Abd: Soft. NTND. BS present. No Masses palpated. Small open wound on abdomen from old drain site.  Ext: No erythema. No edema.  Skin: No rashes, purpura or petechiae.  Neuro: Normal gait. PERLA. EOMi. Alert. Grossly intact.  Psych: Anxious, argumentative with husband, agitated. No SI/HI.  ED Course  Procedures (including critical care time) Labs Review Labs Reviewed - No data to display Imaging Review No results found.   EKG Interpretation None       MDM   Final diagnoses:  Anxiety   Patient presents to the ED after anxiety attack last night. She blames doxycycline she is taking for "sore throat" for the  increase in anxiety. She was told not to take doxy and Wellbutrin together and went without Wellbutrin and her sleep aid for 4 days. Patient is a cancer pt receiving chemotherapy. Patient looks non-toxic and in no acute distress. Symptoms are likely viral. Exam did not reveal LAD or signs of infection. Patient was placed back on her Wellbutrin and sleep aid. She electively discontinued the doxycyline after two days of use and will not take it any longer.  Patient refuses to take other anti-anxiety medications. Do not feel at this time patient needs additional antibiotics. Patient was encouraged to follow up with PCP with in a week, or sooner if her symptoms become worse or she develops fever.  Ma Hillock, DO 04/26/13 1458

## 2013-05-03 ENCOUNTER — Ambulatory Visit (INDEPENDENT_AMBULATORY_CARE_PROVIDER_SITE_OTHER): Payer: BC Managed Care – PPO | Admitting: Licensed Clinical Social Worker

## 2013-05-03 DIAGNOSIS — F331 Major depressive disorder, recurrent, moderate: Secondary | ICD-10-CM

## 2013-05-03 DIAGNOSIS — F411 Generalized anxiety disorder: Secondary | ICD-10-CM

## 2013-05-13 ENCOUNTER — Telehealth: Payer: Self-pay | Admitting: *Deleted

## 2013-05-13 ENCOUNTER — Other Ambulatory Visit: Payer: Self-pay | Admitting: *Deleted

## 2013-05-13 NOTE — Telephone Encounter (Signed)
Pt states that she lost last RX that she was given in the office will call in pending approval

## 2013-05-13 NOTE — Telephone Encounter (Signed)
Jacqueline Buchanan needs a refill for the Generic of Lunesta called in to [place].  SHe said she can not find the one that she was given in the office.

## 2013-05-14 MED ORDER — ESZOPICLONE 2 MG PO TABS: ORAL_TABLET | ORAL | Status: DC

## 2013-05-14 NOTE — Telephone Encounter (Signed)
Called in [place] to [place] per pt request

## 2013-05-16 ENCOUNTER — Telehealth: Payer: Self-pay | Admitting: *Deleted

## 2013-05-16 NOTE — Telephone Encounter (Signed)
PA obtained on Lunesta, medication called in to [facility]

## 2013-05-17 ENCOUNTER — Ambulatory Visit (INDEPENDENT_AMBULATORY_CARE_PROVIDER_SITE_OTHER): Payer: BC Managed Care – PPO | Admitting: Licensed Clinical Social Worker

## 2013-05-17 DIAGNOSIS — F411 Generalized anxiety disorder: Secondary | ICD-10-CM

## 2013-05-17 DIAGNOSIS — F331 Major depressive disorder, recurrent, moderate: Secondary | ICD-10-CM

## 2013-05-17 NOTE — Telephone Encounter (Signed)
Pt called to ask about progress of PA on lunesta

## 2013-05-27 ENCOUNTER — Ambulatory Visit: Payer: BC Managed Care – PPO | Admitting: Licensed Clinical Social Worker

## 2013-06-10 ENCOUNTER — Ambulatory Visit: Payer: BC Managed Care – PPO | Admitting: Licensed Clinical Social Worker

## 2013-08-09 ENCOUNTER — Telehealth: Payer: Self-pay | Admitting: *Deleted

## 2013-08-09 ENCOUNTER — Other Ambulatory Visit: Payer: Self-pay | Admitting: Internal Medicine

## 2013-08-09 NOTE — Telephone Encounter (Signed)
Refilled Lunesta.

## 2013-11-24 ENCOUNTER — Other Ambulatory Visit: Payer: Self-pay | Admitting: Internal Medicine

## 2013-11-25 NOTE — Telephone Encounter (Signed)
Refill request-eh

## 2013-11-26 NOTE — Telephone Encounter (Signed)
RX called into [facility]

## 2013-12-23 ENCOUNTER — Encounter (HOSPITAL_BASED_OUTPATIENT_CLINIC_OR_DEPARTMENT_OTHER): Payer: Self-pay | Admitting: Emergency Medicine

## 2014-03-04 ENCOUNTER — Encounter: Payer: Self-pay | Admitting: Internal Medicine

## 2016-04-21 DEATH — deceased
